# Patient Record
Sex: Female | Born: 1950
Health system: Southern US, Community
[De-identification: ages and names within clinical notes are randomized; demographics above are authoritative.]

---

## 1993-11-28 HISTORY — PX: BREAST EXCISIONAL BIOPSY: SUR124

## 1993-11-28 HISTORY — PX: BREAST BIOPSY: SHX20

## 1999-11-19 ENCOUNTER — Encounter: Admission: RE | Admit: 1999-11-19 | Discharge: 1999-11-19 | Payer: Self-pay | Admitting: *Deleted

## 1999-11-19 ENCOUNTER — Encounter: Payer: Self-pay | Admitting: *Deleted

## 2000-05-22 ENCOUNTER — Encounter: Payer: Self-pay | Admitting: *Deleted

## 2000-05-22 ENCOUNTER — Encounter: Admission: RE | Admit: 2000-05-22 | Discharge: 2000-05-22 | Payer: Self-pay | Admitting: *Deleted

## 2000-07-28 ENCOUNTER — Encounter: Admission: RE | Admit: 2000-07-28 | Discharge: 2000-07-28 | Payer: Self-pay | Admitting: General Surgery

## 2000-07-28 ENCOUNTER — Encounter: Payer: Self-pay | Admitting: General Surgery

## 2001-05-24 ENCOUNTER — Encounter: Payer: Self-pay | Admitting: Internal Medicine

## 2001-05-24 ENCOUNTER — Encounter: Admission: RE | Admit: 2001-05-24 | Discharge: 2001-05-24 | Payer: Self-pay | Admitting: Internal Medicine

## 2001-09-11 ENCOUNTER — Encounter: Admission: RE | Admit: 2001-09-11 | Discharge: 2001-09-11 | Payer: Self-pay | Admitting: Internal Medicine

## 2001-09-11 ENCOUNTER — Encounter: Payer: Self-pay | Admitting: Internal Medicine

## 2002-05-15 ENCOUNTER — Encounter: Admission: RE | Admit: 2002-05-15 | Discharge: 2002-05-15 | Payer: Self-pay | Admitting: Internal Medicine

## 2002-05-15 ENCOUNTER — Encounter: Payer: Self-pay | Admitting: Internal Medicine

## 2003-05-19 ENCOUNTER — Encounter: Admission: RE | Admit: 2003-05-19 | Discharge: 2003-05-19 | Payer: Self-pay | Admitting: Internal Medicine

## 2003-05-19 ENCOUNTER — Encounter: Payer: Self-pay | Admitting: Internal Medicine

## 2003-07-02 ENCOUNTER — Ambulatory Visit (HOSPITAL_COMMUNITY): Admission: RE | Admit: 2003-07-02 | Discharge: 2003-07-02 | Payer: Self-pay | Admitting: Gastroenterology

## 2004-05-19 ENCOUNTER — Encounter: Admission: RE | Admit: 2004-05-19 | Discharge: 2004-05-19 | Payer: Self-pay | Admitting: Internal Medicine

## 2004-05-20 ENCOUNTER — Encounter: Admission: RE | Admit: 2004-05-20 | Discharge: 2004-05-20 | Payer: Self-pay | Admitting: Internal Medicine

## 2005-09-26 ENCOUNTER — Other Ambulatory Visit: Admission: RE | Admit: 2005-09-26 | Discharge: 2005-09-26 | Payer: Self-pay | Admitting: Internal Medicine

## 2005-09-26 ENCOUNTER — Encounter: Admission: RE | Admit: 2005-09-26 | Discharge: 2005-09-26 | Payer: Self-pay | Admitting: Internal Medicine

## 2006-09-27 ENCOUNTER — Encounter: Admission: RE | Admit: 2006-09-27 | Discharge: 2006-09-27 | Payer: Self-pay | Admitting: Internal Medicine

## 2007-10-04 ENCOUNTER — Other Ambulatory Visit: Admission: RE | Admit: 2007-10-04 | Discharge: 2007-10-04 | Payer: Self-pay | Admitting: Internal Medicine

## 2007-10-04 ENCOUNTER — Encounter: Admission: RE | Admit: 2007-10-04 | Discharge: 2007-10-04 | Payer: Self-pay | Admitting: Internal Medicine

## 2007-10-12 ENCOUNTER — Encounter: Admission: RE | Admit: 2007-10-12 | Discharge: 2007-10-12 | Payer: Self-pay | Admitting: Internal Medicine

## 2008-03-28 ENCOUNTER — Encounter: Admission: RE | Admit: 2008-03-28 | Discharge: 2008-03-28 | Payer: Self-pay | Admitting: Internal Medicine

## 2008-12-15 ENCOUNTER — Encounter: Admission: RE | Admit: 2008-12-15 | Discharge: 2008-12-15 | Payer: Self-pay | Admitting: Internal Medicine

## 2009-06-11 ENCOUNTER — Encounter: Admission: RE | Admit: 2009-06-11 | Discharge: 2009-06-11 | Payer: Self-pay | Admitting: Internal Medicine

## 2009-12-24 ENCOUNTER — Encounter: Admission: RE | Admit: 2009-12-24 | Discharge: 2009-12-24 | Payer: Self-pay | Admitting: Internal Medicine

## 2009-12-24 ENCOUNTER — Other Ambulatory Visit: Admission: RE | Admit: 2009-12-24 | Discharge: 2009-12-24 | Payer: Self-pay | Admitting: Preventative Medicine

## 2010-12-18 ENCOUNTER — Other Ambulatory Visit: Payer: Self-pay | Admitting: Internal Medicine

## 2010-12-18 DIAGNOSIS — Z1239 Encounter for other screening for malignant neoplasm of breast: Secondary | ICD-10-CM

## 2011-01-17 ENCOUNTER — Ambulatory Visit
Admission: RE | Admit: 2011-01-17 | Discharge: 2011-01-17 | Disposition: A | Discharge status: Home / Self Care | Payer: BC Managed Care – PPO | Source: Ambulatory Visit | Attending: Internal Medicine | Admitting: Internal Medicine

## 2011-01-17 DIAGNOSIS — Z1239 Encounter for other screening for malignant neoplasm of breast: Secondary | ICD-10-CM

## 2011-01-24 ENCOUNTER — Other Ambulatory Visit: Payer: Self-pay | Admitting: Internal Medicine

## 2011-01-24 DIAGNOSIS — Z1231 Encounter for screening mammogram for malignant neoplasm of breast: Secondary | ICD-10-CM

## 2011-04-15 NOTE — Op Note (Signed)
   Heather White, Heather White                           ACCOUNT NO.:  [id number]   MEDICAL RECORD NO.:  [id number]                   PATIENT TYPE:  AMB   LOCATION:  ENDO                                 FACILITY:  [facility]   PHYSICIAN:  Danise Edge, M.D.                DATE OF BIRTH:  1951/05/28   DATE OF PROCEDURE:  07/02/2003  DATE OF DISCHARGE:                                 OPERATIVE REPORT   PROCEDURE:  Screening colonoscopy.   INDICATIONS:  The patient is a 60 year old female born [birth year].  The patient is referred to undergo her first screening colonoscopy with  polypectomy to prevent colon cancer.   ENDOSCOPIST:  Charolett Bumpers, M.D.   PREMEDICATION:  1. Demerol 50 mg.  2. Versed 5 mg.   DESCRIPTION OF PROCEDURE:  After obtaining informed consent, the patient was  placed in the left lateral decubitus position.  I administered intravenous  Demerol and intravenous Versed to achieve conscious sedation for the  procedure.  The patient's blood pressure, oxygen saturation and cardiac  rhythm were monitored throughout the procedure and documented in the medical  record.   Anal inspection was normal.  Digital rectal examination was normal.  The  Olympus pediatric colonoscope was introduced into the rectum and advanced to  the cecum.  The patient does have a tortuous left colon, with tentatively  deformed loop formation.  Colonic preparation for the examination today was  excellent.   FINDINGS:  RECTUM:  Normal.  SIGMOID COLON:  Normal.  DESCENDING COLON:  Normal.  SPLENIC FLEXURE:  Normal.  TRANSVERSE COLON:  Normal.  HEPATIC FLEXURE:  Normal.  ASCENDING COLON:  Normal.  CECUM AND ILEOCECAL VALVE:  Normal.   ASSESSMENT:  Normal screening proctocolonoscopy to the cecum.                                                Danise Edge, M.D.    MJ/MEDQ  D:  07/02/2003  T:  07/02/2003  Job:  [id number]   cc:   Georgann Housekeeper, M.D.  [address]  [place]  [place] [address]  Fax: [phone]

## 2012-01-23 ENCOUNTER — Ambulatory Visit
Admission: RE | Admit: 2012-01-23 | Discharge: 2012-01-23 | Disposition: A | Discharge status: Home / Self Care | Payer: BC Managed Care – PPO | Source: Ambulatory Visit | Attending: Internal Medicine | Admitting: Internal Medicine

## 2012-01-23 ENCOUNTER — Ambulatory Visit: Payer: BC Managed Care – PPO

## 2012-01-23 DIAGNOSIS — Z1231 Encounter for screening mammogram for malignant neoplasm of breast: Secondary | ICD-10-CM

## 2012-11-29 ENCOUNTER — Other Ambulatory Visit: Payer: Self-pay | Admitting: Internal Medicine

## 2012-11-29 DIAGNOSIS — Z1231 Encounter for screening mammogram for malignant neoplasm of breast: Secondary | ICD-10-CM

## 2013-01-28 ENCOUNTER — Other Ambulatory Visit (HOSPITAL_COMMUNITY)
Admission: RE | Admit: 2013-01-28 | Discharge: 2013-01-28 | Disposition: A | Discharge status: Home / Self Care | Payer: BC Managed Care – PPO | Source: Ambulatory Visit | Attending: Internal Medicine | Admitting: Internal Medicine

## 2013-01-28 ENCOUNTER — Ambulatory Visit
Admission: RE | Admit: 2013-01-28 | Discharge: 2013-01-28 | Disposition: A | Discharge status: Home / Self Care | Payer: BC Managed Care – PPO | Source: Ambulatory Visit | Attending: Internal Medicine | Admitting: Internal Medicine

## 2013-01-28 ENCOUNTER — Other Ambulatory Visit: Payer: Self-pay | Admitting: Internal Medicine

## 2013-01-28 DIAGNOSIS — Z1231 Encounter for screening mammogram for malignant neoplasm of breast: Secondary | ICD-10-CM

## 2013-01-28 DIAGNOSIS — Z01419 Encounter for gynecological examination (general) (routine) without abnormal findings: Secondary | ICD-10-CM | POA: Insufficient documentation

## 2013-01-28 DIAGNOSIS — Z1151 Encounter for screening for human papillomavirus (HPV): Secondary | ICD-10-CM | POA: Insufficient documentation

## 2013-05-23 ENCOUNTER — Other Ambulatory Visit: Payer: Self-pay | Admitting: Gastroenterology

## 2013-12-09 ENCOUNTER — Other Ambulatory Visit: Payer: Self-pay

## 2013-12-09 DIAGNOSIS — Z1231 Encounter for screening mammogram for malignant neoplasm of breast: Secondary | ICD-10-CM

## 2014-01-30 ENCOUNTER — Ambulatory Visit
Admission: RE | Admit: 2014-01-30 | Discharge: 2014-01-30 | Disposition: A | Discharge status: Home / Self Care | Payer: BC Managed Care – PPO | Source: Ambulatory Visit

## 2014-01-30 DIAGNOSIS — Z1231 Encounter for screening mammogram for malignant neoplasm of breast: Secondary | ICD-10-CM

## 2014-12-05 ENCOUNTER — Other Ambulatory Visit: Payer: Self-pay

## 2014-12-05 DIAGNOSIS — Z1231 Encounter for screening mammogram for malignant neoplasm of breast: Secondary | ICD-10-CM

## 2015-02-20 ENCOUNTER — Ambulatory Visit
Admission: RE | Admit: 2015-02-20 | Discharge: 2015-02-20 | Disposition: A | Discharge status: Home / Self Care | Payer: BC Managed Care – PPO | Source: Ambulatory Visit

## 2015-02-20 DIAGNOSIS — Z1231 Encounter for screening mammogram for malignant neoplasm of breast: Secondary | ICD-10-CM

## 2015-09-23 ENCOUNTER — Other Ambulatory Visit: Payer: Self-pay

## 2015-09-23 DIAGNOSIS — Z1231 Encounter for screening mammogram for malignant neoplasm of breast: Secondary | ICD-10-CM

## 2016-02-29 ENCOUNTER — Ambulatory Visit
Admission: RE | Admit: 2016-02-29 | Discharge: 2016-02-29 | Disposition: A | Discharge status: Home / Self Care | Payer: BC Managed Care – PPO | Source: Ambulatory Visit

## 2016-02-29 DIAGNOSIS — Z1231 Encounter for screening mammogram for malignant neoplasm of breast: Secondary | ICD-10-CM

## 2016-03-14 ENCOUNTER — Other Ambulatory Visit: Payer: Self-pay | Admitting: Internal Medicine

## 2016-03-14 DIAGNOSIS — R110 Nausea: Secondary | ICD-10-CM

## 2016-03-14 DIAGNOSIS — R1010 Upper abdominal pain, unspecified: Secondary | ICD-10-CM

## 2016-03-14 DIAGNOSIS — R1013 Epigastric pain: Secondary | ICD-10-CM

## 2016-03-17 ENCOUNTER — Ambulatory Visit
Admission: RE | Admit: 2016-03-17 | Discharge: 2016-03-17 | Disposition: A | Discharge status: Home / Self Care | Payer: BC Managed Care – PPO | Source: Ambulatory Visit | Attending: Internal Medicine | Admitting: Internal Medicine

## 2016-03-17 ENCOUNTER — Other Ambulatory Visit: Payer: Self-pay | Admitting: Internal Medicine

## 2016-03-17 DIAGNOSIS — R1010 Upper abdominal pain, unspecified: Secondary | ICD-10-CM

## 2016-03-17 DIAGNOSIS — R110 Nausea: Secondary | ICD-10-CM

## 2016-03-17 DIAGNOSIS — R1013 Epigastric pain: Secondary | ICD-10-CM

## 2016-03-21 ENCOUNTER — Other Ambulatory Visit: Payer: Self-pay | Admitting: Gastroenterology

## 2016-03-28 ENCOUNTER — Other Ambulatory Visit (HOSPITAL_COMMUNITY): Payer: Self-pay | Admitting: Internal Medicine

## 2016-03-28 DIAGNOSIS — R110 Nausea: Secondary | ICD-10-CM

## 2016-03-28 DIAGNOSIS — R109 Unspecified abdominal pain: Secondary | ICD-10-CM

## 2016-03-31 ENCOUNTER — Ambulatory Visit (HOSPITAL_COMMUNITY)
Admission: RE | Admit: 2016-03-31 | Discharge: 2016-03-31 | Disposition: A | Discharge status: Home / Self Care | Payer: BC Managed Care – PPO | Source: Ambulatory Visit | Attending: Internal Medicine | Admitting: Internal Medicine

## 2016-03-31 DIAGNOSIS — R109 Unspecified abdominal pain: Secondary | ICD-10-CM | POA: Diagnosis not present

## 2016-03-31 DIAGNOSIS — R110 Nausea: Secondary | ICD-10-CM | POA: Diagnosis not present

## 2016-03-31 MED ORDER — TECHNETIUM TC 99M MEBROFENIN IV KIT
5.2000 | PACK | Freq: Once | INTRAVENOUS | Status: AC | PRN
  Administered 2016-03-31: 5 via INTRAVENOUS

## 2016-04-22 ENCOUNTER — Other Ambulatory Visit: Payer: Self-pay | Admitting: Internal Medicine

## 2016-04-22 ENCOUNTER — Ambulatory Visit
Admission: RE | Admit: 2016-04-22 | Discharge: 2016-04-22 | Disposition: A | Discharge status: Home / Self Care | Payer: BC Managed Care – PPO | Source: Ambulatory Visit | Attending: Internal Medicine | Admitting: Internal Medicine

## 2016-04-22 DIAGNOSIS — R1010 Upper abdominal pain, unspecified: Secondary | ICD-10-CM

## 2016-04-22 DIAGNOSIS — R112 Nausea with vomiting, unspecified: Secondary | ICD-10-CM

## 2016-04-22 DIAGNOSIS — R1032 Left lower quadrant pain: Secondary | ICD-10-CM

## 2016-04-22 MED ORDER — IOPAMIDOL (ISOVUE-300) INJECTION 61%
100.0000 mL | Freq: Once | INTRAVENOUS | Status: AC | PRN
  Administered 2016-04-22: 100 mL via INTRAVENOUS

## 2016-04-27 ENCOUNTER — Ambulatory Visit
Admission: RE | Admit: 2016-04-27 | Discharge: 2016-04-27 | Disposition: A | Discharge status: Home / Self Care | Payer: BC Managed Care – PPO | Source: Ambulatory Visit | Attending: Internal Medicine | Admitting: Internal Medicine

## 2016-04-27 ENCOUNTER — Other Ambulatory Visit: Payer: Self-pay | Admitting: Internal Medicine

## 2016-04-27 DIAGNOSIS — R531 Weakness: Secondary | ICD-10-CM

## 2016-04-29 ENCOUNTER — Other Ambulatory Visit: Payer: Self-pay | Admitting: Internal Medicine

## 2016-04-29 DIAGNOSIS — R531 Weakness: Secondary | ICD-10-CM

## 2016-05-05 ENCOUNTER — Ambulatory Visit
Admission: RE | Admit: 2016-05-05 | Discharge: 2016-05-05 | Disposition: A | Discharge status: Home / Self Care | Payer: BC Managed Care – PPO | Source: Ambulatory Visit | Attending: Internal Medicine | Admitting: Internal Medicine

## 2016-05-05 DIAGNOSIS — R531 Weakness: Secondary | ICD-10-CM

## 2016-05-05 MED ORDER — IOPAMIDOL (ISOVUE-300) INJECTION 61%
75.0000 mL | Freq: Once | INTRAVENOUS | Status: AC | PRN
  Administered 2016-05-05: 75 mL via INTRAVENOUS

## 2016-05-13 DIAGNOSIS — I951 Orthostatic hypotension: Secondary | ICD-10-CM | POA: Diagnosis not present

## 2016-05-13 DIAGNOSIS — R55 Syncope and collapse: Secondary | ICD-10-CM | POA: Diagnosis not present

## 2016-05-13 DIAGNOSIS — R112 Nausea with vomiting, unspecified: Secondary | ICD-10-CM | POA: Diagnosis not present

## 2016-05-14 DIAGNOSIS — I951 Orthostatic hypotension: Secondary | ICD-10-CM | POA: Diagnosis not present

## 2016-05-14 DIAGNOSIS — R112 Nausea with vomiting, unspecified: Secondary | ICD-10-CM | POA: Diagnosis not present

## 2016-05-14 DIAGNOSIS — R55 Syncope and collapse: Secondary | ICD-10-CM | POA: Diagnosis not present

## 2016-05-25 ENCOUNTER — Other Ambulatory Visit (HOSPITAL_COMMUNITY): Payer: BC Managed Care – PPO

## 2016-06-13 ENCOUNTER — Institutional Professional Consult (permissible substitution): Payer: BC Managed Care – PPO | Admitting: Pulmonary Disease

## 2016-11-28 HISTORY — PX: BREAST BIOPSY: SHX20

## 2016-12-27 ENCOUNTER — Other Ambulatory Visit: Payer: Self-pay | Admitting: Internal Medicine

## 2016-12-27 DIAGNOSIS — Z1231 Encounter for screening mammogram for malignant neoplasm of breast: Secondary | ICD-10-CM

## 2017-03-29 ENCOUNTER — Ambulatory Visit
Admission: RE | Admit: 2017-03-29 | Discharge: 2017-03-29 | Disposition: A | Discharge status: Home / Self Care | Payer: BC Managed Care – PPO | Source: Ambulatory Visit | Attending: Internal Medicine | Admitting: Internal Medicine

## 2017-03-29 DIAGNOSIS — Z1231 Encounter for screening mammogram for malignant neoplasm of breast: Secondary | ICD-10-CM

## 2017-03-30 ENCOUNTER — Other Ambulatory Visit: Payer: Self-pay | Admitting: Internal Medicine

## 2017-03-30 DIAGNOSIS — R928 Other abnormal and inconclusive findings on diagnostic imaging of breast: Secondary | ICD-10-CM

## 2017-04-03 ENCOUNTER — Ambulatory Visit
Admission: RE | Admit: 2017-04-03 | Discharge: 2017-04-03 | Disposition: A | Discharge status: Home / Self Care | Payer: BC Managed Care – PPO | Source: Ambulatory Visit | Attending: Internal Medicine | Admitting: Internal Medicine

## 2017-04-03 ENCOUNTER — Other Ambulatory Visit: Payer: Self-pay | Admitting: Internal Medicine

## 2017-04-03 DIAGNOSIS — R921 Mammographic calcification found on diagnostic imaging of breast: Secondary | ICD-10-CM

## 2017-04-03 DIAGNOSIS — R928 Other abnormal and inconclusive findings on diagnostic imaging of breast: Secondary | ICD-10-CM

## 2017-04-05 ENCOUNTER — Ambulatory Visit
Admission: RE | Admit: 2017-04-05 | Discharge: 2017-04-05 | Disposition: A | Discharge status: Home / Self Care | Payer: BC Managed Care – PPO | Source: Ambulatory Visit | Attending: Internal Medicine | Admitting: Internal Medicine

## 2017-04-05 DIAGNOSIS — R921 Mammographic calcification found on diagnostic imaging of breast: Secondary | ICD-10-CM

## 2017-06-06 IMAGING — NM NM HEPATO W/GB/PHARM/[PERSON_NAME]
2 series · 12 of 12 positions shown · non-contrast
Comparison: Ultrasound March 17, 2016.

CLINICAL DATA: Epigastric abdominal pain, vomiting, weight loss.

EXAM:
NUCLEAR MEDICINE HEPATOBILIARY IMAGING WITH GALLBLADDER EF
TECHNIQUE: Sequential images of the abdomen were obtained [DATE] minutes
following intravenous administration of radiopharmaceutical. After
oral ingestion of Ensure, gallbladder ejection fraction was
determined. At 60 min, normal ejection fraction is greater than 33%.
RADIOPHARMACEUTICALS:  5.2 mCi 8c-LLm  Choletec IV

[Series 1: biliary · 4.14mm/px · 6 of 52 frames shown]
[frame 5/52]
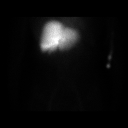
[frame 13/52]
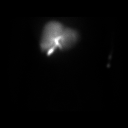
[frame 22/52]
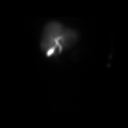
[frame 31/52]
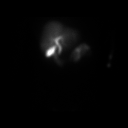
[frame 39/52]
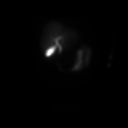
[frame 48/52]
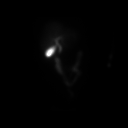

[Series 3: gbef · 4.14mm/px · 6 of 60 frames shown]
[frame 6/60]
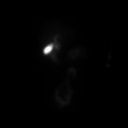
[frame 16/60]
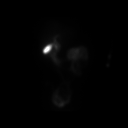
[frame 26/60]
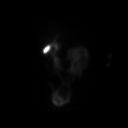
[frame 36/60]
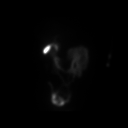
[frame 46/60]
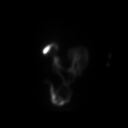
[frame 56/60]
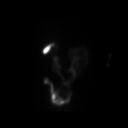

[12 of 12 positions shown; findings below may reference images not displayed]

FINDINGS: Prompt uptake and biliary excretion of activity by the liver is
seen. Gallbladder activity is visualized, consistent with patency of
cystic duct. Biliary activity passes into small bowel, consistent
with patent common bile duct.

Calculated gallbladder ejection fraction is 52%. (Normal gallbladder
ejection fraction with Ensure is greater than 33%.) patient reported
no discomfort her pain after ingesting Ensure.
IMPRESSION: Normal gallbladder ejection fraction is noted after Ensure
administration.

## 2018-01-18 ENCOUNTER — Other Ambulatory Visit: Payer: Self-pay | Admitting: Internal Medicine

## 2018-01-18 DIAGNOSIS — Z1231 Encounter for screening mammogram for malignant neoplasm of breast: Secondary | ICD-10-CM

## 2018-03-30 ENCOUNTER — Ambulatory Visit
Admission: RE | Admit: 2018-03-30 | Discharge: 2018-03-30 | Disposition: A | Discharge status: Home / Self Care | Payer: BC Managed Care – PPO | Source: Ambulatory Visit | Attending: Internal Medicine | Admitting: Internal Medicine

## 2018-03-30 DIAGNOSIS — Z1231 Encounter for screening mammogram for malignant neoplasm of breast: Secondary | ICD-10-CM

## 2018-04-02 ENCOUNTER — Other Ambulatory Visit: Payer: Self-pay | Admitting: Internal Medicine

## 2018-04-02 DIAGNOSIS — R921 Mammographic calcification found on diagnostic imaging of breast: Secondary | ICD-10-CM

## 2018-04-04 ENCOUNTER — Ambulatory Visit
Admission: RE | Admit: 2018-04-04 | Discharge: 2018-04-04 | Disposition: A | Discharge status: Home / Self Care | Payer: BC Managed Care – PPO | Source: Ambulatory Visit | Attending: Internal Medicine | Admitting: Internal Medicine

## 2018-04-04 ENCOUNTER — Other Ambulatory Visit: Payer: Self-pay | Admitting: Internal Medicine

## 2018-04-04 DIAGNOSIS — R921 Mammographic calcification found on diagnostic imaging of breast: Secondary | ICD-10-CM

## 2018-06-18 ENCOUNTER — Ambulatory Visit: Payer: Self-pay | Admitting: Podiatry

## 2018-06-21 ENCOUNTER — Other Ambulatory Visit: Payer: Self-pay | Admitting: Sports Medicine

## 2018-06-21 ENCOUNTER — Ambulatory Visit: Payer: BC Managed Care – PPO | Admitting: Sports Medicine

## 2018-06-21 ENCOUNTER — Ambulatory Visit (INDEPENDENT_AMBULATORY_CARE_PROVIDER_SITE_OTHER): Payer: BC Managed Care – PPO

## 2018-06-21 ENCOUNTER — Encounter: Payer: Self-pay | Admitting: Sports Medicine

## 2018-06-21 VITALS — BP 102/59 | HR 67 | Temp 98.9°F | Resp 16 | Ht 63.0 in | Wt 127.0 lb

## 2018-06-21 DIAGNOSIS — M779 Enthesopathy, unspecified: Secondary | ICD-10-CM

## 2018-06-21 DIAGNOSIS — M21619 Bunion of unspecified foot: Secondary | ICD-10-CM

## 2018-06-21 DIAGNOSIS — M7742 Metatarsalgia, left foot: Secondary | ICD-10-CM | POA: Diagnosis not present

## 2018-06-21 DIAGNOSIS — M79672 Pain in left foot: Secondary | ICD-10-CM

## 2018-06-21 DIAGNOSIS — M2040 Other hammer toe(s) (acquired), unspecified foot: Secondary | ICD-10-CM

## 2018-06-21 MED ORDER — TRIAMCINOLONE ACETONIDE 10 MG/ML IJ SUSP: 10.0000 mg | Freq: Once | INTRAMUSCULAR | Status: AC

## 2018-06-21 NOTE — Progress Notes (Signed)
   Subjective:    Patient ID: Heather White, female    DOB: Jun 06, 1951, 67 y.o.   MRN: [id number]  HPI    Review of Systems  Musculoskeletal: Positive for arthralgias and myalgias.  All other systems reviewed and are negative.      Objective:   Physical Exam        Assessment & Plan:

## 2018-06-21 NOTE — Progress Notes (Signed)
Subjective: Heather White is a 67 y.o. female patient who presents to office for evaluation of Left foot pain. Patient complains of progressive pain especially over the last year in the left foot at the ball underneath the second and third toes that is sharp in nature on average 5 out of 10 states that when she is walking extensively or standing pain worsens however she has found certain shoes that seem to help states that she has tried massage and Epson salt with no complete relief in symptoms and also has tried Aleve.  Patient states that the areas used to swell but no longer is there is swelling.  Patient denies any known injury however does admit to an incident of having her toes flexed back after she tripped over a rug in her home that happened roughly about a year ago.  Patient does admits to a history of Addison's disease and is on long standing steroids by mouth and also has a history of hypothyroidism.Patient denies any other pedal complaints.   Review of Systems  Musculoskeletal: Positive for joint pain and myalgias.  All other systems reviewed and are negative.   There are no active problems to display for this patient.   Current Outpatient Medications on File Prior to Visit  Medication Sig Dispense Refill  . Ascorbic Acid (VITAMIN C) 100 MG tablet Take 100 mg by mouth daily.    Marland Kitchen atorvastatin (LIPITOR) 10 MG tablet Take 10 mg by mouth daily.    . clonazePAM (KLONOPIN) 0.5 MG tablet Take 0.5 mg by mouth 2 (two) times daily as needed for anxiety.    . Coenzyme Q10 (COQ10) 200 MG CAPS Take by mouth.    . escitalopram (LEXAPRO) 5 MG tablet Take 5 mg by mouth daily.    . hydrocortisone (CORTEF) 10 MG tablet Take 10 mg by mouth daily.    Marland Kitchen levothyroxine (SYNTHROID, LEVOTHROID) 25 MCG tablet Take 25 mcg by mouth daily before breakfast.    . vitamin E 400 UNIT capsule Take 400 Units by mouth daily.     No current facility-administered medications on file prior to visit.     Allergies   Allergen Reactions  . Sulfa Antibiotics Rash    Objective:  General: Alert and oriented x3 in no acute distress  Dermatology: No open lesions bilateral lower extremities, no webspace macerations, no ecchymosis bilateral, all nails x 10 are well manicured.  Vascular: Dorsalis Pedis and Posterior Tibial pedal pulses palpable 1 out of 4, Capillary Fill Time 3 seconds,(+) pedal hair growth bilateral, no edema bilateral lower extremities, Temperature gradient within normal limits.  Neurology: Johney Maine sensation intact via light touch bilateral, (- )Tinels sign bilateral.   Musculoskeletal: Mild tenderness with palpation at left second metatarsophalangeal joint greater than the left third metatarsophalangeal joint,No pain with calf compression bilateral.  Patient has significant structural bunion and mild varus rotated fourth and fifth hammertoes left greater than right. Strength within normal limits in all groups bilateral.   Gait: Antalgic gait  Xrays  Left foot   Impression: Normal osseous mineralization, mild joint space narrowing and mild erosions at the first metatarsophalangeal joint at the area of bunion with large medial eminence suggestive of arthritis there is mild metatarsal space narrowing at the second and third metatarsals, there is mild varus rotated fourth and fifth hammertoe deformity, all soft tissues are within normal limits, no other acute findings.  Assessment and Plan: Problem List Items Addressed This Visit    None    Visit  Diagnoses    Capsulitis    -  Primary   Relevant Medications   triamcinolone acetonide (KENALOG) 10 MG/ML injection 10 mg   Left foot pain       Metatarsalgia of left foot       Bunion       Hammer toe, unspecified laterality         -Complete examination performed -Xrays reviewed -Discussed treatement options for likely metatarsalgia and capsulitis secondary to structural deformity versus sprain injury versus partial tear at plantar  plate -After oral consent and aseptic prep, injected a mixture containing 1 ml of 2%  plain lidocaine, 1 ml 0.5% plain marcaine, 0.5 ml of kenalog 10 and 0.5 ml of dexamethasone phosphate into left foot second and third intermetatarsal space and lesser metatarsophalangeal joints without complication. Post-injection care discussed with patient.  -Applied removable strapping with metatarsal padding to left forefoot advised patient if this works well would benefit from custom orthotics -Patient to return to office in 3 to 4 weeks for follow-up evaluation or sooner if condition worsens.  Landis Martins, DPM

## 2018-07-12 ENCOUNTER — Encounter: Payer: Self-pay | Admitting: Sports Medicine

## 2018-07-12 ENCOUNTER — Ambulatory Visit: Payer: BC Managed Care – PPO | Admitting: Sports Medicine

## 2018-07-12 DIAGNOSIS — M779 Enthesopathy, unspecified: Secondary | ICD-10-CM

## 2018-07-12 DIAGNOSIS — M7742 Metatarsalgia, left foot: Secondary | ICD-10-CM | POA: Diagnosis not present

## 2018-07-12 DIAGNOSIS — M79672 Pain in left foot: Secondary | ICD-10-CM | POA: Diagnosis not present

## 2018-07-12 NOTE — Progress Notes (Signed)
Subjective: Heather White is a 67 y.o. female patient who returns to office for follow up evaluation of Left foot pain. Patient now ranks pain 2/10 and states that she is feeling better. Patient has been using met pad with relief in symptoms. Denies bruising, swelling, warmth, redness, or any other pedal symptoms. Patient denies any other pedal complaints.   There are no active problems to display for this patient.  Current Outpatient Medications on File Prior to Visit  Medication Sig Dispense Refill  . Ascorbic Acid (VITAMIN C) 100 MG tablet Take 100 mg by mouth daily.    Marland Kitchen atorvastatin (LIPITOR) 10 MG tablet Take 10 mg by mouth daily.    . clonazePAM (KLONOPIN) 0.5 MG tablet Take 0.5 mg by mouth 2 (two) times daily as needed for anxiety.    . Coenzyme Q10 (COQ10) 200 MG CAPS Take by mouth.    . escitalopram (LEXAPRO) 5 MG tablet Take 5 mg by mouth daily.    . hydrocortisone (CORTEF) 10 MG tablet Take 10 mg by mouth daily.    Marland Kitchen levothyroxine (SYNTHROID, LEVOTHROID) 25 MCG tablet Take 25 mcg by mouth daily before breakfast.    . vitamin E 400 UNIT capsule Take 400 Units by mouth daily.     Current Facility-Administered Medications on File Prior to Visit  Medication Dose Route Frequency Provider Last Rate Last Dose  . triamcinolone acetonide (KENALOG) 10 MG/ML injection 10 mg  10 mg Other Once Landis Martins, DPM       Allergies  Allergen Reactions  . Sulfa Antibiotics Rash    Objective:  General: Alert and oriented x3 in no acute distress  Dermatology: No open lesions bilateral lower extremities, no webspace macerations, no ecchymosis bilateral, all nails x 10 are well manicured.  Neurovascular: Intact. No lower extremity edema bilateral.   Musculoskeletal:Decreased tenderness with palpation at Left plantar forefoot especially sub met 2-3, No pain with calf compression bilateral. Range of motion in all pedal joints are within normal limits without pain or crepitation. + bunion and  hammertoe deformity, Strength within normal limits in all groups bilateral.   Assessment and Plan: Problem List Items Addressed This Visit    None    Visit Diagnoses    Metatarsalgia of left foot    -  Primary   Capsulitis       Left foot pain           -Complete examination performed -Discussed continued care for resolving capsulitis -Recommend good supportive shoes -Topical OTC pain creams PRN -Recommend custom insoles if patient desires to get them to schedule patient to return to office for casting with Betha.   Landis Martins, DPM

## 2018-08-02 ENCOUNTER — Ambulatory Visit: Payer: BC Managed Care – PPO | Admitting: *Deleted

## 2018-08-02 DIAGNOSIS — M779 Enthesopathy, unspecified: Secondary | ICD-10-CM

## 2018-08-02 DIAGNOSIS — M7742 Metatarsalgia, left foot: Secondary | ICD-10-CM

## 2018-09-12 ENCOUNTER — Ambulatory Visit (INDEPENDENT_AMBULATORY_CARE_PROVIDER_SITE_OTHER): Payer: BC Managed Care – PPO

## 2018-09-12 ENCOUNTER — Ambulatory Visit: Payer: BC Managed Care – PPO | Admitting: Sports Medicine

## 2018-09-12 ENCOUNTER — Encounter: Payer: Self-pay | Admitting: Sports Medicine

## 2018-09-12 DIAGNOSIS — M79672 Pain in left foot: Secondary | ICD-10-CM | POA: Diagnosis not present

## 2018-09-12 DIAGNOSIS — M779 Enthesopathy, unspecified: Secondary | ICD-10-CM | POA: Diagnosis not present

## 2018-09-12 DIAGNOSIS — M84378A Stress fracture, left toe(s), initial encounter for fracture: Secondary | ICD-10-CM

## 2018-09-12 MED ORDER — TRIAMCINOLONE ACETONIDE 10 MG/ML IJ SUSP: 10.0000 mg | Freq: Once | INTRAMUSCULAR | Status: AC

## 2018-09-12 NOTE — Patient Instructions (Signed)

## 2018-09-12 NOTE — Progress Notes (Signed)
Subjective: Heather White is a 67 y.o. female patient who presents to office for evaluation of left foot pain from the base of the fourth toe onto the top of the foot for the last month states that the pain is worse with pressure and walking states that pain is 7 out of 10 denies any injury has been using Advil icing does note swelling by the end of the day.  Patient is also here for pickup of orthotics. Patient denies any other pedal complaints.  There are no active problems to display for this patient.   Current Outpatient Medications on File Prior to Visit  Medication Sig Dispense Refill  . Ascorbic Acid (VITAMIN C) 100 MG tablet Take 100 mg by mouth daily.    Marland Kitchen atorvastatin (LIPITOR) 10 MG tablet Take 10 mg by mouth daily.    . clonazePAM (KLONOPIN) 0.5 MG tablet Take 0.5 mg by mouth 2 (two) times daily as needed for anxiety.    . Coenzyme Q10 (COQ10) 200 MG CAPS Take by mouth.    . escitalopram (LEXAPRO) 5 MG tablet Take 5 mg by mouth daily.    . hydrocortisone (CORTEF) 10 MG tablet Take 10 mg by mouth daily.    Marland Kitchen levothyroxine (SYNTHROID, LEVOTHROID) 25 MCG tablet Take 25 mcg by mouth daily before breakfast.    . SUMAtriptan (IMITREX) 50 MG tablet TAKE 1 TABLET BY MOUTH AT ONSET OF MIGRAINE. MAY REPEAT ONCE AFTER 2 HOURS IF NEEDED - max 2 in 24 hours  6  . vitamin E 400 UNIT capsule Take 400 Units by mouth daily.     Current Facility-Administered Medications on File Prior to Visit  Medication Dose Route Frequency Provider Last Rate Last Dose  . triamcinolone acetonide (KENALOG) 10 MG/ML injection 10 mg  10 mg Other Once Landis Martins, DPM        Allergies  Allergen Reactions  . Sulfa Antibiotics Rash    Objective:  General: Alert and oriented x3 in no acute distress  Dermatology: No open lesions bilateral lower extremities, no webspace macerations, no ecchymosis bilateral, all nails x 10 are well manicured.  Vascular: Dorsalis Pedis and Posterior Tibial pedal pulses palpable,  Capillary Fill Time 3 seconds,(+) pedal hair growth bilateral, no edema bilateral lower extremities, Temperature gradient within normal limits.  Neurology: Johney Maine sensation intact via light touch bilateral.  Musculoskeletal: Mild tenderness with palpation at fourth metatarsal shaft and along the extensor tendon.  Bunion and hammertoe deformity no pain with palpation of the left plantar forefoot  Gait: Antalgic gait  Xrays  Left foot   Impression: Normal osseous mineralization there is no acute findings especially along the fourth metatarsal concerning of any type of acute stress fracture which is the reason why we x-ray to rule this out likely patient's symptoms could be from tendinitis.  Assessment and Plan: Problem List Items Addressed This Visit    None    Visit Diagnoses    Left foot pain    -  Primary   Relevant Medications   triamcinolone acetonide (KENALOG) 10 MG/ML injection 10 mg (Start on 09/12/2018 10:45 AM)   Other Relevant Orders   DG Foot Complete Left   Capsulitis       Relevant Medications   triamcinolone acetonide (KENALOG) 10 MG/ML injection 10 mg (Start on 09/12/2018 10:45 AM)   Other Relevant Orders   DG Foot Complete Left   Stress fracture of phalanx of toe of left foot, initial encounter  Relevant Orders   DG Foot Complete Left   Tendonitis       Relevant Medications   triamcinolone acetonide (KENALOG) 10 MG/ML injection 10 mg (Start on 09/12/2018 10:45 AM)       -Complete examination performed -Xrays reviewed -Discussed treatement options for likely tendinitis -After oral consent and aseptic prep, injected a mixture containing 1 ml of 2%  plain lidocaine, 1 ml 0.5% plain marcaine, 0.5 ml of kenalog 10 and 0.5 ml of dexamethasone phosphate into extensor tendon to the left fourth toe without complication. Post-injection care discussed with patient.  -Recommend protection rest ice elevation and to refrain from wearing her custom insoles for this week  may use try them on next week slowly with break in.  Explained as they were dispensed on today's visit advised patient if pain worsens to return to office  Landis Martins, DPM

## 2018-09-18 ENCOUNTER — Other Ambulatory Visit: Payer: Self-pay | Admitting: Sports Medicine

## 2018-09-18 DIAGNOSIS — M84378A Stress fracture, left toe(s), initial encounter for fracture: Secondary | ICD-10-CM

## 2018-09-18 DIAGNOSIS — M79672 Pain in left foot: Secondary | ICD-10-CM

## 2018-09-18 DIAGNOSIS — M779 Enthesopathy, unspecified: Secondary | ICD-10-CM

## 2018-09-28 ENCOUNTER — Ambulatory Visit (INDEPENDENT_AMBULATORY_CARE_PROVIDER_SITE_OTHER): Payer: Medicare Other | Admitting: Sports Medicine

## 2018-09-28 ENCOUNTER — Encounter: Payer: Self-pay | Admitting: Sports Medicine

## 2018-09-28 DIAGNOSIS — T148XXA Other injury of unspecified body region, initial encounter: Secondary | ICD-10-CM

## 2018-09-28 DIAGNOSIS — M779 Enthesopathy, unspecified: Secondary | ICD-10-CM

## 2018-09-28 DIAGNOSIS — M7752 Other enthesopathy of left foot: Secondary | ICD-10-CM

## 2018-09-28 DIAGNOSIS — M79672 Pain in left foot: Secondary | ICD-10-CM

## 2018-09-28 NOTE — Progress Notes (Signed)
Subjective: Heather White is a 67 y.o. female patient who presents to office for evaluation of left foot pain from the base of the fourth toe onto the top of the foot reports that when she first wear her orthotics she wore them all day by mistake and developed a blister on the side of the big toe joint but did feel like they are helping however still has pretty significant pain at the top of her left foot that has not gotten any better after I gave her a shot of medicine states that the pain is about 5 out of 10 states that she is been using Aleve and icing states when she rest her foot it feels better and she just retired from her job on yesterday.  Patient denies any new trauma or injury to her left foot but does state that prior to having pain and possibly several months before she did remember stubbing her foot and having some bruising and is unsure if this is related to the current pain that she is having at the top of her left foot.  No other acute symptoms noted.  Denies nausea vomiting fever chills does admit to a little bruising otherwise denies any other pedal complaints at this time.  There are no active problems to display for this patient.   Current Outpatient Medications on File Prior to Visit  Medication Sig Dispense Refill  . Ascorbic Acid (VITAMIN C) 100 MG tablet Take 100 mg by mouth daily.    Marland Kitchen atorvastatin (LIPITOR) 10 MG tablet Take 10 mg by mouth daily.    . clonazePAM (KLONOPIN) 0.5 MG tablet Take 0.5 mg by mouth 2 (two) times daily as needed for anxiety.    . Coenzyme Q10 (COQ10) 200 MG CAPS Take by mouth.    . escitalopram (LEXAPRO) 5 MG tablet Take 5 mg by mouth daily.    . hydrocortisone (CORTEF) 10 MG tablet Take 10 mg by mouth daily.    Marland Kitchen levothyroxine (SYNTHROID, LEVOTHROID) 25 MCG tablet Take 25 mcg by mouth daily before breakfast.    . SUMAtriptan (IMITREX) 50 MG tablet TAKE 1 TABLET BY MOUTH AT ONSET OF MIGRAINE. MAY REPEAT ONCE AFTER 2 HOURS IF NEEDED - max 2 in 24  hours  6  . vitamin E 400 UNIT capsule Take 400 Units by mouth daily.     Current Facility-Administered Medications on File Prior to Visit  Medication Dose Route Frequency Provider Last Rate Last Dose  . triamcinolone acetonide (KENALOG) 10 MG/ML injection 10 mg  10 mg Other Once Landis Martins, DPM      . triamcinolone acetonide (KENALOG) 10 MG/ML injection 10 mg  10 mg Other Once Landis Martins, DPM        Allergies  Allergen Reactions  . Sulfa Antibiotics Rash    Objective:  General: Alert and oriented x3 in no acute distress  Dermatology: No open lesions bilateral lower extremities, no webspace macerations, minimal faint ecchymosis dorsal lateral midfoot on the left which could likely be related to injection last visit, all nails x 10 are well manicured.  Vascular: Dorsalis Pedis and Posterior Tibial pedal pulses palpable, Capillary Fill Time 3 seconds,(+) pedal hair growth bilateral, no edema bilateral lower extremities, Temperature gradient within normal limits.  Neurology: Johney Maine sensation intact via light touch bilateral.  Musculoskeletal: Mild tenderness with palpation at fourth metatarsal shaft and along the extensor tendon and pain with dorsiflexion of foot along the corresponding area.  Bunion and hammertoe deformity no pain  with palpation of the left plantar forefoot  Assessment and Plan: Problem List Items Addressed This Visit    None    Visit Diagnoses    Tendon tear    -  Primary   Capsulitis       Left foot pain           -Complete examination performed -Previous xrays reviewed -Discussed treatement options for possible tendon tear since symptoms continue to be present -Dispensed cam boot and advised patient rest ice elevation and Aleve as needed -Ordered MRI for further evaluation to rule out tear or other underlying issue on left foot -Advised patient for now to refrain from wearing orthotics until after her MRI if negative will resume orthotics with  break-in timetable reiterated to patient . Patient to return to office after MRI.  Landis Martins, DPM

## 2018-10-04 ENCOUNTER — Telehealth: Payer: Self-pay | Admitting: *Deleted

## 2018-10-04 DIAGNOSIS — T148XXA Other injury of unspecified body region, initial encounter: Secondary | ICD-10-CM

## 2018-10-04 NOTE — Telephone Encounter (Signed)
Patient called to check the status of her MRI it has been a week and she has not heard anything.  Do not see any documentation in chart.

## 2018-10-04 NOTE — Telephone Encounter (Signed)
-----   Message from Landis Martins, [place] sent at 10/04/2018 12:12 PM EST ----- Regarding: MRI L Foot R/o tendon tear

## 2018-10-04 NOTE — Telephone Encounter (Signed)
Orders to J. Quintana, RN for pre-cert. 

## 2018-10-04 NOTE — Telephone Encounter (Signed)
I sent this request to valery for her to follow up on order Thanks Dr. Cannon Kettle

## 2018-10-08 NOTE — Telephone Encounter (Signed)
[facility] scheduled [phone] MRI right foot for 10/13/2018 arrive 9:15am for 9:30am. Faxed orders to [place]. I informed pt of the appt date and time.

## 2018-10-11 ENCOUNTER — Telehealth: Payer: Self-pay | Admitting: Sports Medicine

## 2018-10-11 DIAGNOSIS — H52223 Regular astigmatism, bilateral: Secondary | ICD-10-CM | POA: Diagnosis not present

## 2018-10-11 DIAGNOSIS — H5203 Hypermetropia, bilateral: Secondary | ICD-10-CM | POA: Diagnosis not present

## 2018-10-11 DIAGNOSIS — H524 Presbyopia: Secondary | ICD-10-CM | POA: Diagnosis not present

## 2018-10-11 DIAGNOSIS — H43812 Vitreous degeneration, left eye: Secondary | ICD-10-CM | POA: Diagnosis not present

## 2018-10-11 NOTE — Progress Notes (Signed)
Patient ID: Heather White, female   DOB: 1951-10-05, 67 y.o.   MRN: [id number]  Patient presents at Dr.Stover's request to be casted for a custom molded orthotics with [facility] Certified Pedorthist.  Patient will return in 4 weeks to be fitted.

## 2018-10-11 NOTE — Telephone Encounter (Signed)
Authorization for MRI for patient was done for the Hospital and should have been done for [facility]. Can this be updated and resent?

## 2018-10-12 ENCOUNTER — Other Ambulatory Visit: Payer: Self-pay | Admitting: Internal Medicine

## 2018-10-12 ENCOUNTER — Ambulatory Visit
Admission: RE | Admit: 2018-10-12 | Discharge: 2018-10-12 | Disposition: A | Discharge status: Home / Self Care | Payer: BC Managed Care – PPO | Source: Ambulatory Visit | Attending: Internal Medicine | Admitting: Internal Medicine

## 2018-10-12 ENCOUNTER — Other Ambulatory Visit: Payer: Self-pay

## 2018-10-12 DIAGNOSIS — R921 Mammographic calcification found on diagnostic imaging of breast: Secondary | ICD-10-CM

## 2018-10-12 NOTE — Telephone Encounter (Signed)
[employer] - Larena Glassman states due system problems providers are asked to call back after 9:00am.

## 2018-10-12 NOTE — Telephone Encounter (Signed)
Gretta Arab, RN states she will make the change of location on line with [employer].

## 2018-10-13 DIAGNOSIS — S96911A Strain of unspecified muscle and tendon at ankle and foot level, right foot, initial encounter: Secondary | ICD-10-CM | POA: Diagnosis not present

## 2018-10-13 DIAGNOSIS — S92341A Displaced fracture of fourth metatarsal bone, right foot, initial encounter for closed fracture: Secondary | ICD-10-CM | POA: Diagnosis not present

## 2018-10-13 DIAGNOSIS — X58XXXA Exposure to other specified factors, initial encounter: Secondary | ICD-10-CM | POA: Diagnosis not present

## 2018-10-16 ENCOUNTER — Telehealth: Payer: Self-pay | Admitting: Sports Medicine

## 2018-10-16 DIAGNOSIS — L820 Inflamed seborrheic keratosis: Secondary | ICD-10-CM | POA: Diagnosis not present

## 2018-10-16 NOTE — Telephone Encounter (Signed)
I called patient to discuss with her MRI findings which reveals a healing 4th metatarsal fracture, hairline. I advised patient to continue with CAM boot and to follow up in office in 3-4 weeks for continued fracture care -Dr. Cannon Kettle

## 2018-10-18 ENCOUNTER — Encounter: Payer: Self-pay | Admitting: Sports Medicine

## 2018-10-18 DIAGNOSIS — E039 Hypothyroidism, unspecified: Secondary | ICD-10-CM | POA: Diagnosis not present

## 2018-10-18 DIAGNOSIS — E271 Primary adrenocortical insufficiency: Secondary | ICD-10-CM | POA: Diagnosis not present

## 2018-11-15 ENCOUNTER — Ambulatory Visit: Payer: Medicare Other | Admitting: Sports Medicine

## 2018-11-15 ENCOUNTER — Other Ambulatory Visit: Payer: Self-pay | Admitting: Sports Medicine

## 2018-11-15 ENCOUNTER — Ambulatory Visit: Payer: BC Managed Care – PPO | Admitting: Sports Medicine

## 2018-11-15 ENCOUNTER — Ambulatory Visit (INDEPENDENT_AMBULATORY_CARE_PROVIDER_SITE_OTHER): Payer: Medicare Other

## 2018-11-15 ENCOUNTER — Encounter: Payer: Self-pay | Admitting: Sports Medicine

## 2018-11-15 DIAGNOSIS — M79672 Pain in left foot: Secondary | ICD-10-CM | POA: Diagnosis not present

## 2018-11-15 DIAGNOSIS — M779 Enthesopathy, unspecified: Secondary | ICD-10-CM | POA: Diagnosis not present

## 2018-11-15 DIAGNOSIS — M84375D Stress fracture, left foot, subsequent encounter for fracture with routine healing: Secondary | ICD-10-CM | POA: Diagnosis not present

## 2018-11-15 DIAGNOSIS — M84378A Stress fracture, left toe(s), initial encounter for fracture: Secondary | ICD-10-CM | POA: Diagnosis not present

## 2018-11-15 DIAGNOSIS — T148XXA Other injury of unspecified body region, initial encounter: Secondary | ICD-10-CM
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
## 2018-12-02 DIAGNOSIS — E039 Hypothyroidism, unspecified: Secondary | ICD-10-CM

## 2018-12-02 DIAGNOSIS — S82852A Displaced trimalleolar fracture of left lower leg, initial encounter for closed fracture: Secondary | ICD-10-CM | POA: Diagnosis not present

## 2018-12-02 DIAGNOSIS — E875 Hyperkalemia: Secondary | ICD-10-CM

## 2018-12-02 DIAGNOSIS — I951 Orthostatic hypotension: Secondary | ICD-10-CM

## 2018-12-02 DIAGNOSIS — E271 Primary adrenocortical insufficiency: Secondary | ICD-10-CM

## 2018-12-02 DIAGNOSIS — R55 Syncope and collapse: Secondary | ICD-10-CM | POA: Diagnosis not present

## 2018-12-13 ENCOUNTER — Ambulatory Visit: Payer: Medicare Other | Admitting: Sports Medicine

## 2019-04-15 DIAGNOSIS — H0012 Chalazion right lower eyelid: Secondary | ICD-10-CM | POA: Diagnosis not present

## 2019-04-29 ENCOUNTER — Ambulatory Visit
Admission: RE | Admit: 2019-04-29 | Discharge: 2019-04-29 | Disposition: A | Discharge status: Home / Self Care | Payer: Medicare Other | Source: Ambulatory Visit | Attending: Internal Medicine | Admitting: Internal Medicine

## 2019-04-29 ENCOUNTER — Other Ambulatory Visit: Payer: Self-pay

## 2019-04-29 DIAGNOSIS — R921 Mammographic calcification found on diagnostic imaging of breast: Secondary | ICD-10-CM

## 2019-05-29 ENCOUNTER — Ambulatory Visit (INDEPENDENT_AMBULATORY_CARE_PROVIDER_SITE_OTHER): Payer: Medicare Other

## 2019-05-29 ENCOUNTER — Encounter: Payer: Self-pay | Admitting: Sports Medicine

## 2019-05-29 ENCOUNTER — Other Ambulatory Visit: Payer: Self-pay

## 2019-05-29 ENCOUNTER — Ambulatory Visit (INDEPENDENT_AMBULATORY_CARE_PROVIDER_SITE_OTHER): Payer: Medicare Other | Admitting: Sports Medicine

## 2019-05-29 ENCOUNTER — Other Ambulatory Visit: Payer: Self-pay | Admitting: Sports Medicine

## 2019-05-29 VITALS — Temp 100.0°F | Resp 16

## 2019-05-29 DIAGNOSIS — M79672 Pain in left foot: Secondary | ICD-10-CM

## 2019-05-29 DIAGNOSIS — M779 Enthesopathy, unspecified: Secondary | ICD-10-CM | POA: Diagnosis not present

## 2019-05-29 DIAGNOSIS — M21619 Bunion of unspecified foot: Secondary | ICD-10-CM

## 2019-05-29 DIAGNOSIS — M2040 Other hammer toe(s) (acquired), unspecified foot: Secondary | ICD-10-CM

## 2019-05-29 DIAGNOSIS — M7752 Other enthesopathy of left foot: Secondary | ICD-10-CM | POA: Diagnosis not present

## 2019-05-29 DIAGNOSIS — M7742 Metatarsalgia, left foot: Secondary | ICD-10-CM

## 2019-05-30 ENCOUNTER — Encounter: Payer: Self-pay | Admitting: Sports Medicine

## 2019-05-30 MED ORDER — TRIAMCINOLONE ACETONIDE 10 MG/ML IJ SUSP
10.0000 mg | Freq: Once | INTRAMUSCULAR | Status: AC
  Administered 2019-05-29: 10 mg

## 2019-05-30 NOTE — Progress Notes (Signed)
Subjective: Heather White is a 68 y.o. female patient who returns to office for follow up evaluation of Left foot pain. Patient reports that the pain has flared up again at the ball of her left foot states that slowly over the last month it has been getting worse pain is sharp 8 out of 10 worse with walking.  Patient reports that she has tried Aleve with no provement in symptoms.  Patient also admits that in January she broke her left ankle and had surgery her ankle feels fine and she has not had any problems since for several months however over the last month has had increasing pain to the second toe joint area and the ball of her left foot like she had when she originally first are coming to our office.  Patient denies any redness warmth significant swelling bruising or any other constitutional symptoms at this time.  Patient denies any other pedal complaints.   There are no active problems to display for this patient.  Current Outpatient Medications on File Prior to Visit  Medication Sig Dispense Refill  . Ascorbic Acid (VITAMIN C) 100 MG tablet Take 100 mg by mouth daily.    Marland Kitchen atorvastatin (LIPITOR) 10 MG tablet Take 10 mg by mouth daily.    . clonazePAM (KLONOPIN) 0.5 MG tablet Take 0.5 mg by mouth 2 (two) times daily as needed for anxiety.    . Coenzyme Q10 (COQ10) 200 MG CAPS Take by mouth.    . escitalopram (LEXAPRO) 5 MG tablet Take 5 mg by mouth daily.    . fludrocortisone (FLORINEF) 0.1 MG tablet Take 100 mcg by mouth daily.    . hydrocortisone (CORTEF) 10 MG tablet Take 10 mg by mouth daily.    Marland Kitchen levothyroxine (SYNTHROID, LEVOTHROID) 25 MCG tablet Take 25 mcg by mouth daily before breakfast.    . SUMAtriptan (IMITREX) 50 MG tablet TAKE 1 TABLET BY MOUTH AT ONSET OF MIGRAINE. MAY REPEAT ONCE AFTER 2 HOURS IF NEEDED - max 2 in 24 hours  6  . vitamin E 400 UNIT capsule Take 400 Units by mouth daily.     Current Facility-Administered Medications on File Prior to Visit  Medication  Dose Route Frequency Provider Last Rate Last Dose  . triamcinolone acetonide (KENALOG) 10 MG/ML injection 10 mg  10 mg Other Once Landis Martins, DPM      . triamcinolone acetonide (KENALOG) 10 MG/ML injection 10 mg  10 mg Other Once Landis Martins, DPM       Allergies  Allergen Reactions  . Sulfa Antibiotics Rash    Objective:  General: Alert and oriented x3 in no acute distress  Dermatology: No open lesions bilateral lower extremities, no webspace macerations, no ecchymosis bilateral, all nails x 10 are well manicured.  Surgical scar is well ankle.  Neurovascular: Intact. No significant lower extremity edema bilateral.   Musculoskeletal: There is tenderness with palpation at Left plantar forefoot especially sub met 2-3, No pain with calf compression bilateral. Range of motion in all pedal joints are within normal limits without pain or crepitation except with manipulation of the second greater than the third metatarsal phalangeal joint on the left. + bunion and hammertoe deformity, Strength within normal limits in all groups bilateral.   X-rays left foot consistent of bunion deformity and mild second toe deviation no fracture or dislocation previous fourth metatarsal shaft fracture is well healed.  Ankle hardware is intact.  No other acute findings.  Assessment and Plan: Problem List Items Addressed  This Visit    None    Visit Diagnoses    Capsulitis    -  Primary   Relevant Medications   triamcinolone acetonide (KENALOG) 10 MG/ML injection 10 mg (Completed) (Start on 05/30/2019  6:15 AM)   Metatarsalgia of left foot       Left foot pain       Relevant Medications   triamcinolone acetonide (KENALOG) 10 MG/ML injection 10 mg (Completed) (Start on 05/30/2019  6:15 AM)   Bunion       Hammer toe, unspecified laterality           -Complete examination performed -Discussed treatment for flareup of capsulitis at left second metatarsophalangeal joint -After oral consent and aseptic  prep, injected a mixture containing 1 ml of 2%  plain lidocaine, 1 ml 0.5% plain marcaine, 0.5 ml of kenalog 10 and 0.5 ml of dexamethasone phosphate into left second metatarsophalangeal joint without complication. Post-injection care discussed with patient.  -Recommend good supportive shoes and metatarsal padding as provided at this visit may use on top of her current insoles -Advised patient to closely monitor symptoms if worsen to return to office after 2 weeks or sooner if problems or issues arise.  Landis Martins, DPM

## 2019-06-04 ENCOUNTER — Other Ambulatory Visit: Payer: Self-pay | Admitting: Sports Medicine

## 2019-06-04 DIAGNOSIS — M79672 Pain in left foot: Secondary | ICD-10-CM

## 2019-06-04 DIAGNOSIS — M21619 Bunion of unspecified foot: Secondary | ICD-10-CM

## 2019-06-04 DIAGNOSIS — M779 Enthesopathy, unspecified: Secondary | ICD-10-CM

## 2019-11-28 ENCOUNTER — Other Ambulatory Visit: Payer: Self-pay | Admitting: Internal Medicine

## 2019-11-28 DIAGNOSIS — R921 Mammographic calcification found on diagnostic imaging of breast: Secondary | ICD-10-CM

## 2019-12-18 IMAGING — MG DIGITAL DIAGNOSTIC UNILATERAL LEFT MAMMOGRAM WITH TOMO AND CAD
2 series · 3 of 6 positions shown · non-contrast
Comparison: Previous exam(s).

CLINICAL DATA: Short-term interval follow-up of probable benign
calcifications in the left breast.

EXAM:
DIGITAL DIAGNOSTIC UNILATERAL LEFT MAMMOGRAM WITH CAD AND TOMO

[L ML]
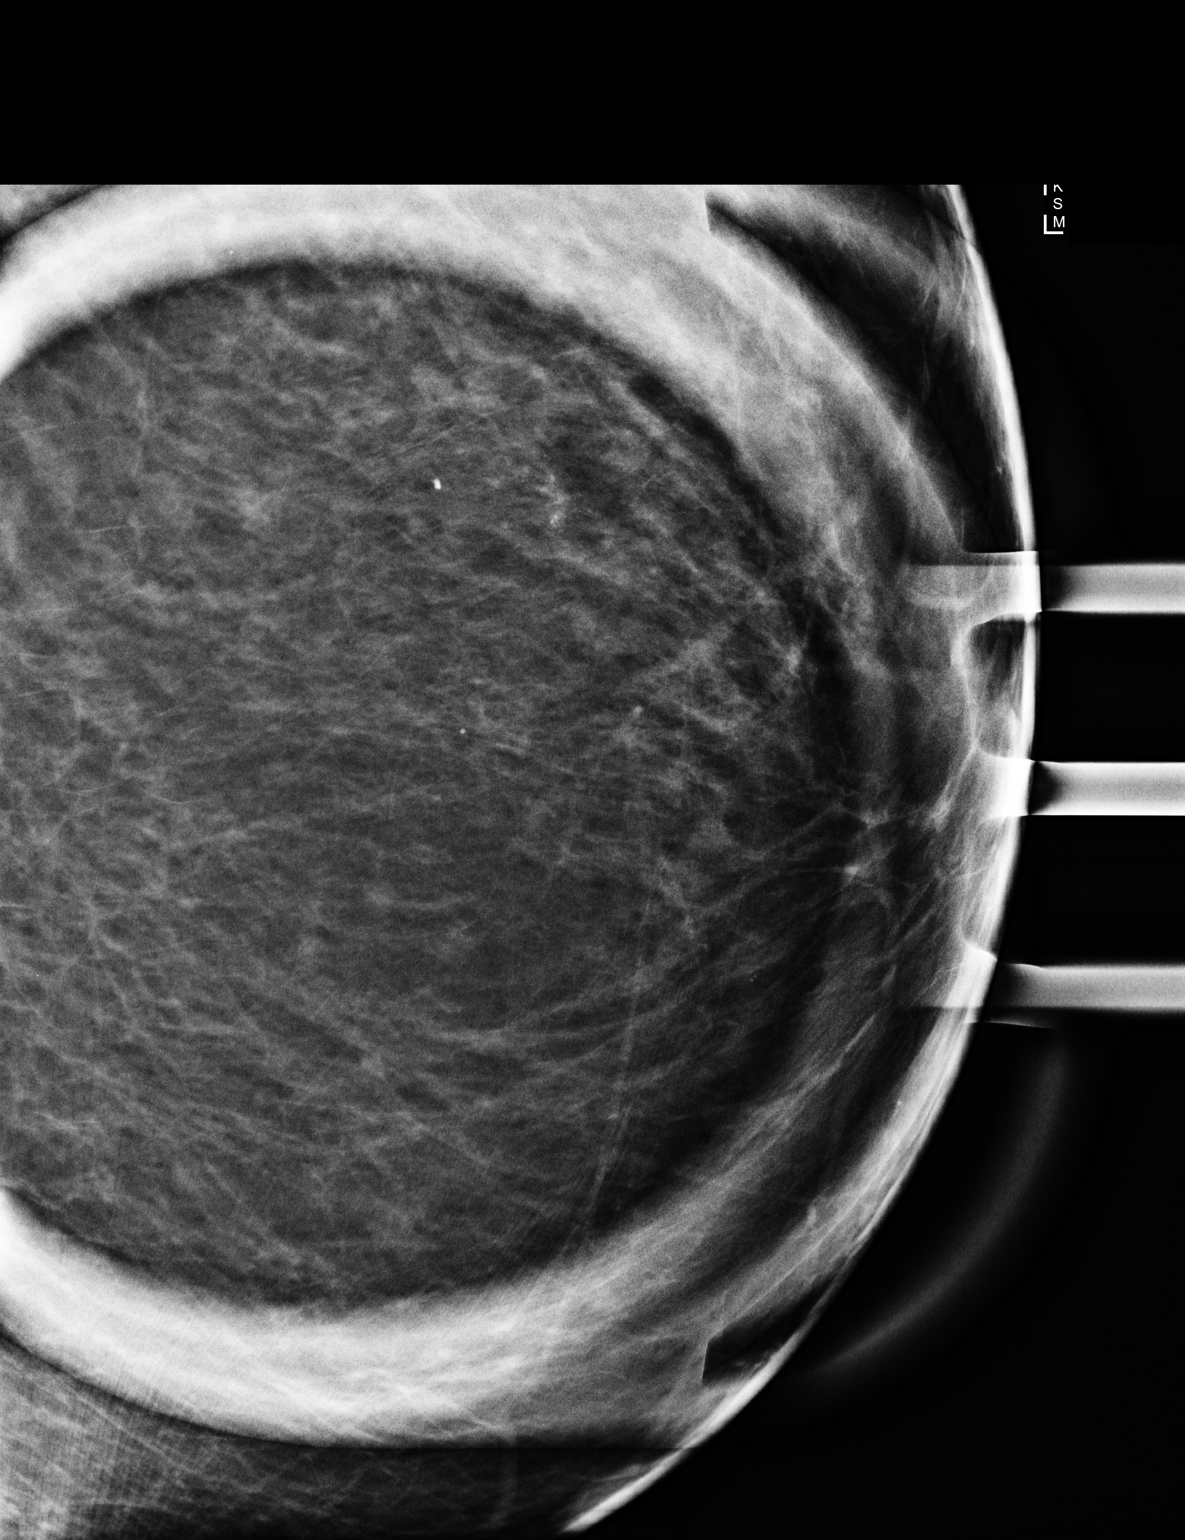

[L MLO tomo · 2 of 73 frames shown]
[frame 24/73]
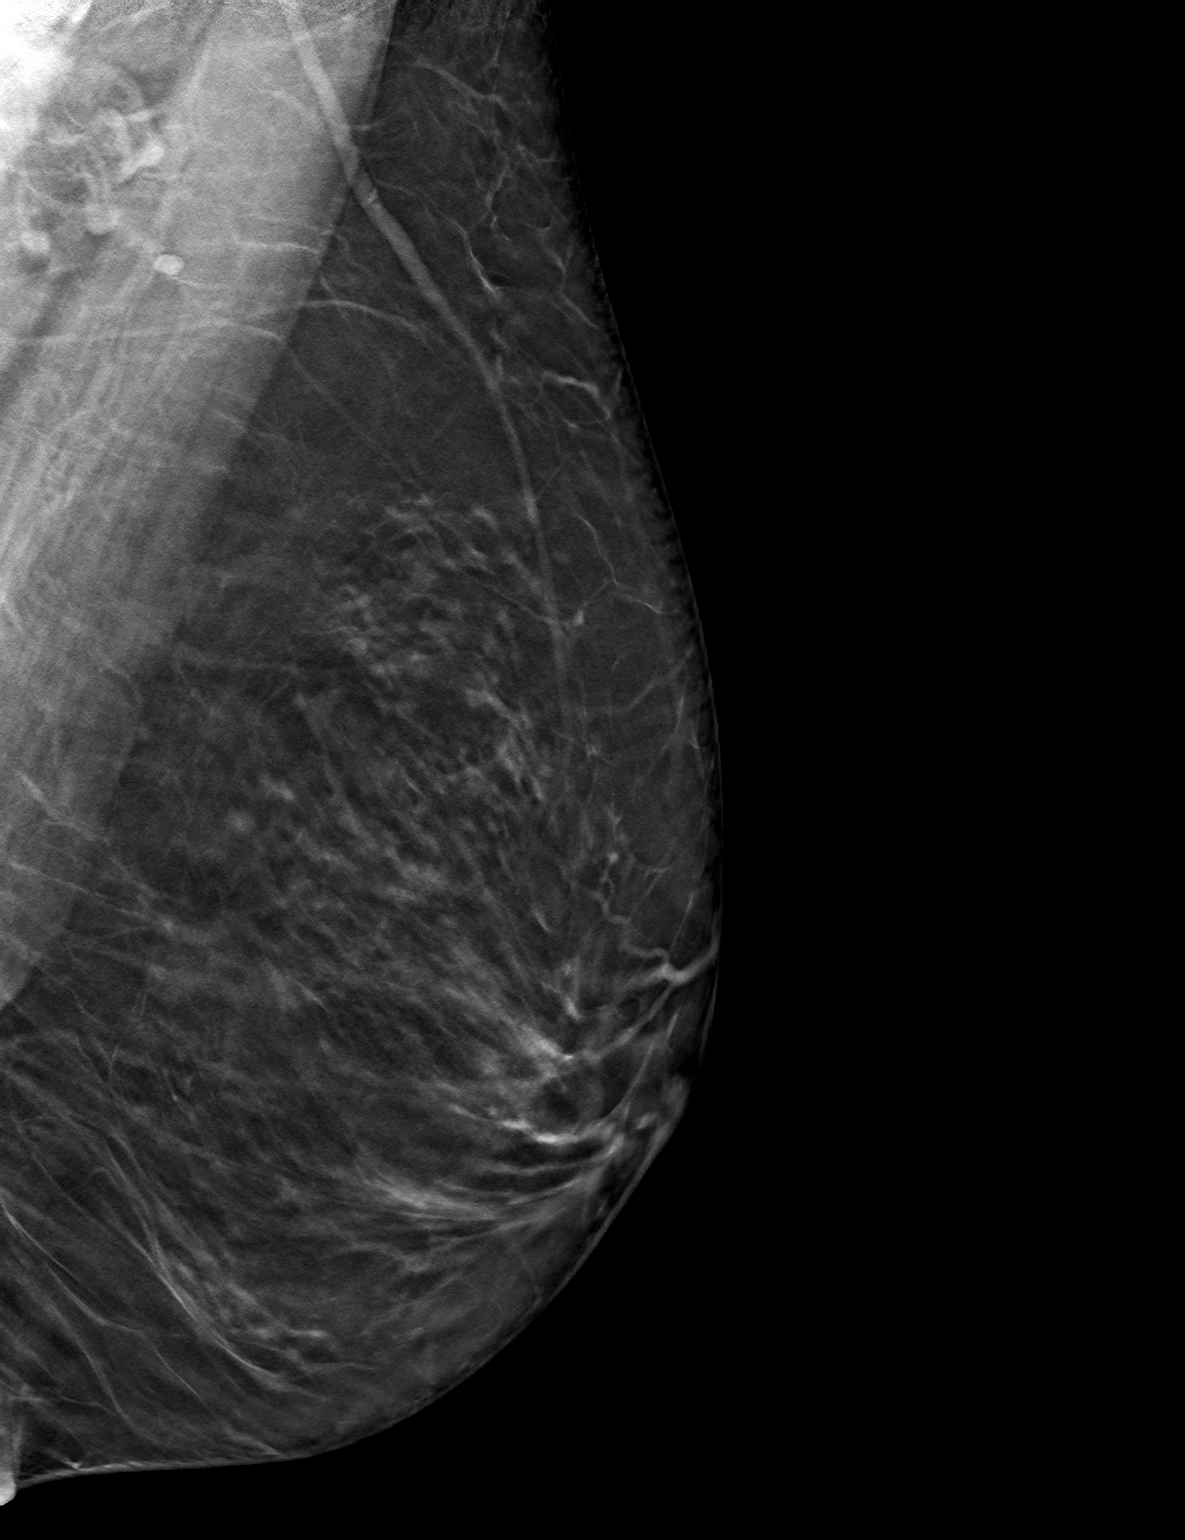
[frame 37/73]
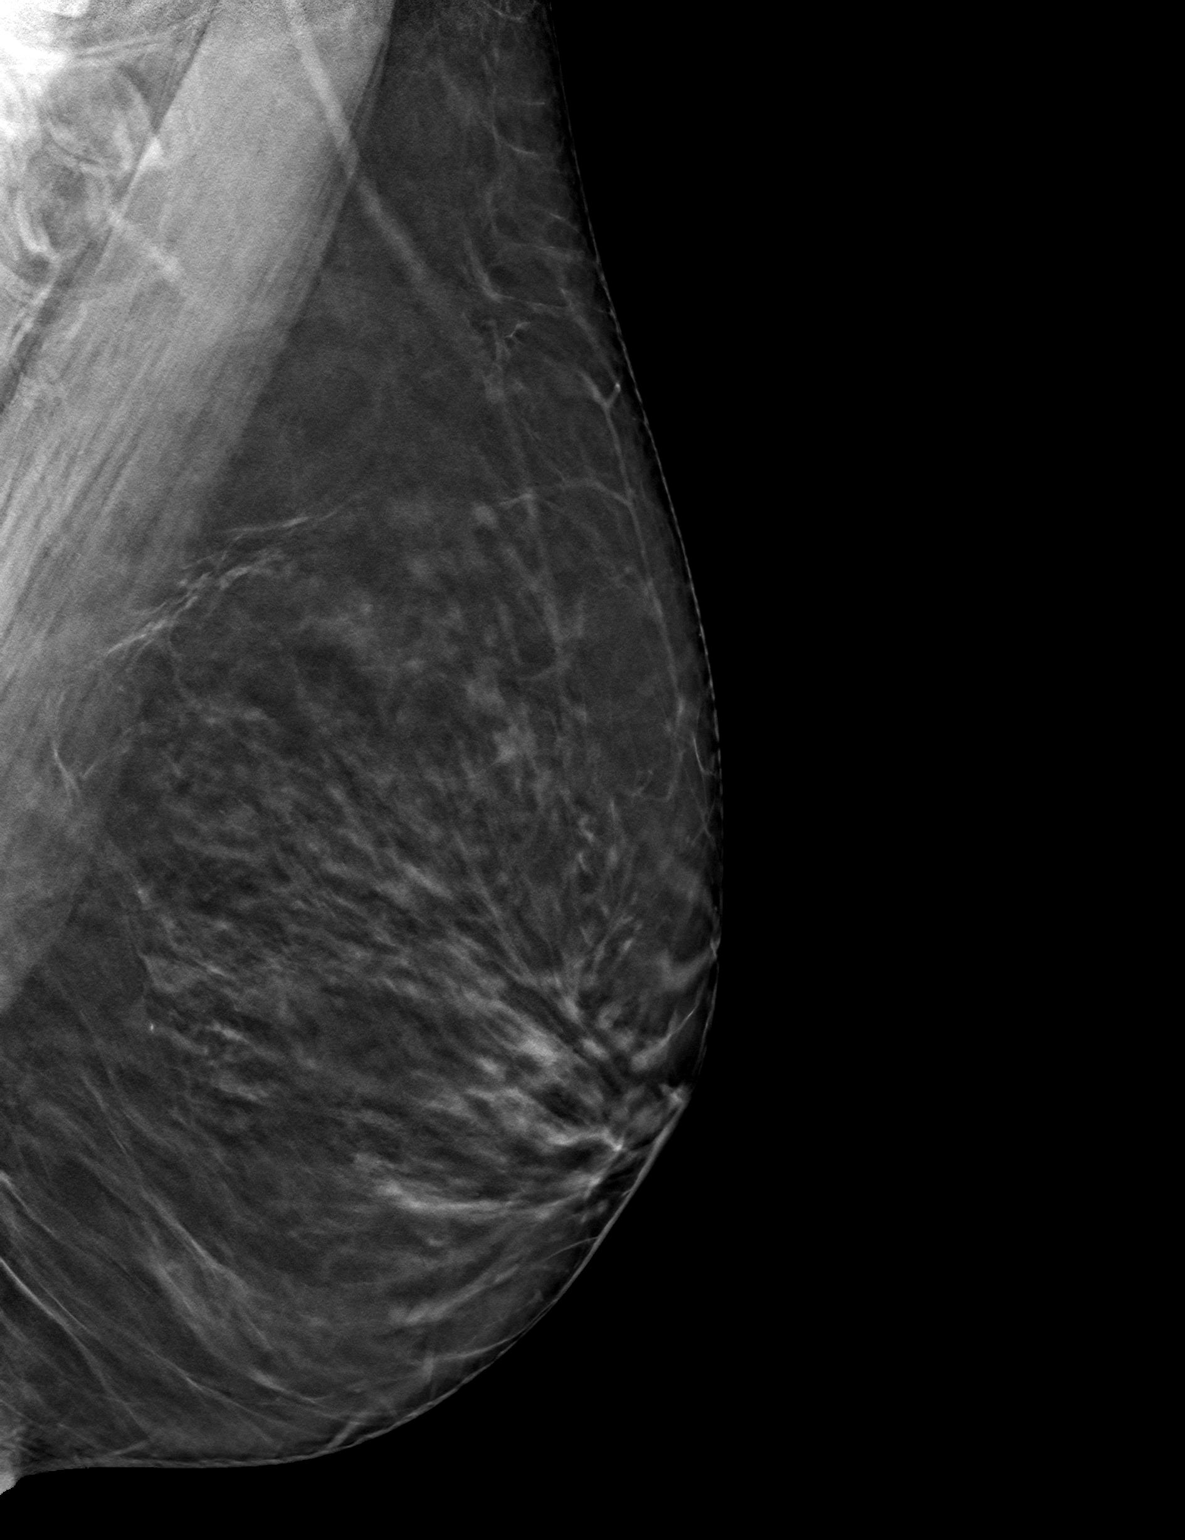

[3 of 6 positions shown; findings below may reference images not displayed]

ACR Breast Density Category b: There are scattered areas of
fibroglandular density.
FINDINGS: Faint benign-appearing calcifications in the medial aspect of the
left breast are stable compared to the prior exam dated April 04, 2018.
No suspicious mass or malignant type microcalcifications identified.

Mammographic images were processed with CAD.
IMPRESSION: Stable probable benign calcifications in the left breast.

RECOMMENDATION:
Short-term interval follow-up bilateral mammogram in 6 months is
recommended.

I have discussed the findings and recommendations with the patient.
Results were also provided in writing at the conclusion of the
visit. If applicable, a reminder letter will be sent to the patient
regarding the next appointment.

BI-RADS CATEGORY  3: Probably benign.

## 2020-06-23 ENCOUNTER — Other Ambulatory Visit: Payer: Self-pay

## 2020-06-23 ENCOUNTER — Ambulatory Visit
Admission: RE | Admit: 2020-06-23 | Discharge: 2020-06-23 | Disposition: A | Discharge status: Home / Self Care | Payer: Medicare Other | Source: Ambulatory Visit | Attending: Internal Medicine | Admitting: Internal Medicine

## 2020-06-23 DIAGNOSIS — R921 Mammographic calcification found on diagnostic imaging of breast: Secondary | ICD-10-CM

## 2020-06-24 ENCOUNTER — Other Ambulatory Visit: Payer: Self-pay | Admitting: Internal Medicine

## 2020-06-24 DIAGNOSIS — M810 Age-related osteoporosis without current pathological fracture: Secondary | ICD-10-CM

## 2020-12-29 ENCOUNTER — Other Ambulatory Visit: Payer: Self-pay | Admitting: Internal Medicine

## 2020-12-29 DIAGNOSIS — Z1231 Encounter for screening mammogram for malignant neoplasm of breast: Secondary | ICD-10-CM

## 2021-02-09 ENCOUNTER — Ambulatory Visit: Payer: Medicare Other | Admitting: Sports Medicine

## 2021-03-05 DIAGNOSIS — R7309 Other abnormal glucose: Secondary | ICD-10-CM | POA: Diagnosis not present

## 2021-03-05 DIAGNOSIS — E271 Primary adrenocortical insufficiency: Secondary | ICD-10-CM | POA: Diagnosis not present

## 2021-04-27 DIAGNOSIS — D485 Neoplasm of uncertain behavior of skin: Secondary | ICD-10-CM | POA: Diagnosis not present

## 2021-04-27 DIAGNOSIS — L820 Inflamed seborrheic keratosis: Secondary | ICD-10-CM | POA: Diagnosis not present

## 2021-04-29 DIAGNOSIS — E271 Primary adrenocortical insufficiency: Secondary | ICD-10-CM | POA: Diagnosis not present

## 2021-04-29 DIAGNOSIS — R7309 Other abnormal glucose: Secondary | ICD-10-CM | POA: Diagnosis not present

## 2021-04-30 DIAGNOSIS — W57XXXA Bitten or stung by nonvenomous insect and other nonvenomous arthropods, initial encounter: Secondary | ICD-10-CM | POA: Diagnosis not present

## 2021-04-30 DIAGNOSIS — E271 Primary adrenocortical insufficiency: Secondary | ICD-10-CM | POA: Diagnosis not present

## 2021-05-26 ENCOUNTER — Ambulatory Visit: Payer: Medicare Other

## 2021-05-26 ENCOUNTER — Ambulatory Visit
Admission: RE | Admit: 2021-05-26 | Discharge: 2021-05-26 | Disposition: A | Discharge status: Home / Self Care | Payer: BC Managed Care – PPO | Source: Ambulatory Visit | Attending: Internal Medicine | Admitting: Internal Medicine

## 2021-05-26 ENCOUNTER — Other Ambulatory Visit: Payer: Self-pay

## 2021-05-26 DIAGNOSIS — Z780 Asymptomatic menopausal state: Secondary | ICD-10-CM | POA: Diagnosis not present

## 2021-05-26 DIAGNOSIS — M810 Age-related osteoporosis without current pathological fracture: Secondary | ICD-10-CM

## 2021-05-26 DIAGNOSIS — M8589 Other specified disorders of bone density and structure, multiple sites: Secondary | ICD-10-CM | POA: Diagnosis not present

## 2021-06-01 DIAGNOSIS — R7303 Prediabetes: Secondary | ICD-10-CM | POA: Diagnosis not present

## 2021-06-01 DIAGNOSIS — E271 Primary adrenocortical insufficiency: Secondary | ICD-10-CM | POA: Diagnosis not present

## 2021-06-25 ENCOUNTER — Ambulatory Visit
Admission: RE | Admit: 2021-06-25 | Discharge: 2021-06-25 | Disposition: A | Discharge status: Home / Self Care | Payer: Medicare Other | Source: Ambulatory Visit | Attending: Internal Medicine | Admitting: Internal Medicine

## 2021-06-25 ENCOUNTER — Other Ambulatory Visit: Payer: Self-pay

## 2021-06-25 DIAGNOSIS — Z1231 Encounter for screening mammogram for malignant neoplasm of breast: Secondary | ICD-10-CM

## 2021-08-17 DIAGNOSIS — E271 Primary adrenocortical insufficiency: Secondary | ICD-10-CM | POA: Diagnosis not present

## 2021-08-17 DIAGNOSIS — G2581 Restless legs syndrome: Secondary | ICD-10-CM | POA: Diagnosis not present

## 2021-08-17 DIAGNOSIS — Z0000 Encounter for general adult medical examination without abnormal findings: Secondary | ICD-10-CM | POA: Diagnosis not present

## 2021-08-17 DIAGNOSIS — G43909 Migraine, unspecified, not intractable, without status migrainosus: Secondary | ICD-10-CM | POA: Diagnosis not present

## 2021-08-17 DIAGNOSIS — Z1389 Encounter for screening for other disorder: Secondary | ICD-10-CM | POA: Diagnosis not present

## 2021-08-17 DIAGNOSIS — Z23 Encounter for immunization: Secondary | ICD-10-CM | POA: Diagnosis not present

## 2021-08-17 DIAGNOSIS — M810 Age-related osteoporosis without current pathological fracture: Secondary | ICD-10-CM | POA: Diagnosis not present

## 2021-08-17 DIAGNOSIS — E039 Hypothyroidism, unspecified: Secondary | ICD-10-CM | POA: Diagnosis not present

## 2021-08-17 DIAGNOSIS — E871 Hypo-osmolality and hyponatremia: Secondary | ICD-10-CM | POA: Diagnosis not present

## 2021-08-17 DIAGNOSIS — E782 Mixed hyperlipidemia: Secondary | ICD-10-CM | POA: Diagnosis not present

## 2021-08-17 DIAGNOSIS — R7303 Prediabetes: Secondary | ICD-10-CM | POA: Diagnosis not present

## 2021-09-03 DIAGNOSIS — R7303 Prediabetes: Secondary | ICD-10-CM | POA: Diagnosis not present

## 2021-09-03 DIAGNOSIS — E271 Primary adrenocortical insufficiency: Secondary | ICD-10-CM | POA: Diagnosis not present

## 2021-09-16 DIAGNOSIS — J209 Acute bronchitis, unspecified: Secondary | ICD-10-CM | POA: Diagnosis not present

## 2021-09-16 DIAGNOSIS — R051 Acute cough: Secondary | ICD-10-CM | POA: Diagnosis not present

## 2021-09-16 DIAGNOSIS — R0981 Nasal congestion: Secondary | ICD-10-CM | POA: Diagnosis not present

## 2021-09-16 DIAGNOSIS — J0100 Acute maxillary sinusitis, unspecified: Secondary | ICD-10-CM | POA: Diagnosis not present

## 2021-10-02 DIAGNOSIS — R051 Acute cough: Secondary | ICD-10-CM | POA: Diagnosis not present

## 2021-10-26 DIAGNOSIS — L821 Other seborrheic keratosis: Secondary | ICD-10-CM | POA: Diagnosis not present

## 2021-10-26 DIAGNOSIS — L820 Inflamed seborrheic keratosis: Secondary | ICD-10-CM | POA: Diagnosis not present

## 2021-10-26 DIAGNOSIS — D225 Melanocytic nevi of trunk: Secondary | ICD-10-CM | POA: Diagnosis not present

## 2022-03-02 ENCOUNTER — Other Ambulatory Visit: Payer: Self-pay | Admitting: Internal Medicine

## 2022-03-02 DIAGNOSIS — Z1231 Encounter for screening mammogram for malignant neoplasm of breast: Secondary | ICD-10-CM

## 2022-03-11 DIAGNOSIS — R7309 Other abnormal glucose: Secondary | ICD-10-CM | POA: Diagnosis not present

## 2022-03-11 DIAGNOSIS — E271 Primary adrenocortical insufficiency: Secondary | ICD-10-CM | POA: Diagnosis not present

## 2022-04-12 DIAGNOSIS — H25813 Combined forms of age-related cataract, bilateral: Secondary | ICD-10-CM | POA: Diagnosis not present

## 2022-04-12 DIAGNOSIS — H524 Presbyopia: Secondary | ICD-10-CM | POA: Diagnosis not present

## 2022-04-12 DIAGNOSIS — H52222 Regular astigmatism, left eye: Secondary | ICD-10-CM | POA: Diagnosis not present

## 2022-04-12 DIAGNOSIS — H35373 Puckering of macula, bilateral: Secondary | ICD-10-CM | POA: Diagnosis not present

## 2022-05-25 DIAGNOSIS — G43909 Migraine, unspecified, not intractable, without status migrainosus: Secondary | ICD-10-CM | POA: Diagnosis not present

## 2022-05-25 DIAGNOSIS — G2581 Restless legs syndrome: Secondary | ICD-10-CM | POA: Diagnosis not present

## 2022-06-20 DIAGNOSIS — M25562 Pain in left knee: Secondary | ICD-10-CM | POA: Diagnosis not present

## 2022-06-20 DIAGNOSIS — M1712 Unilateral primary osteoarthritis, left knee: Secondary | ICD-10-CM | POA: Diagnosis not present

## 2022-06-23 DIAGNOSIS — M25562 Pain in left knee: Secondary | ICD-10-CM | POA: Diagnosis not present

## 2022-06-27 DIAGNOSIS — S82002A Unspecified fracture of left patella, initial encounter for closed fracture: Secondary | ICD-10-CM | POA: Diagnosis not present

## 2022-07-25 DIAGNOSIS — S82002A Unspecified fracture of left patella, initial encounter for closed fracture: Secondary | ICD-10-CM | POA: Diagnosis not present

## 2022-08-15 DIAGNOSIS — S82002A Unspecified fracture of left patella, initial encounter for closed fracture: Secondary | ICD-10-CM | POA: Diagnosis not present

## 2022-08-17 DIAGNOSIS — I781 Nevus, non-neoplastic: Secondary | ICD-10-CM | POA: Diagnosis not present

## 2022-08-17 DIAGNOSIS — L820 Inflamed seborrheic keratosis: Secondary | ICD-10-CM | POA: Diagnosis not present

## 2022-08-29 ENCOUNTER — Ambulatory Visit
Admission: RE | Admit: 2022-08-29 | Discharge: 2022-08-29 | Disposition: A | Discharge status: Home / Self Care | Payer: BC Managed Care – PPO | Source: Ambulatory Visit | Attending: Internal Medicine | Admitting: Internal Medicine

## 2022-08-29 DIAGNOSIS — M810 Age-related osteoporosis without current pathological fracture: Secondary | ICD-10-CM | POA: Diagnosis not present

## 2022-08-29 DIAGNOSIS — Z1231 Encounter for screening mammogram for malignant neoplasm of breast: Secondary | ICD-10-CM

## 2022-08-29 DIAGNOSIS — E042 Nontoxic multinodular goiter: Secondary | ICD-10-CM | POA: Diagnosis not present

## 2022-08-29 DIAGNOSIS — Z23 Encounter for immunization: Secondary | ICD-10-CM | POA: Diagnosis not present

## 2022-08-29 DIAGNOSIS — E871 Hypo-osmolality and hyponatremia: Secondary | ICD-10-CM | POA: Diagnosis not present

## 2022-08-29 DIAGNOSIS — R7303 Prediabetes: Secondary | ICD-10-CM | POA: Diagnosis not present

## 2022-08-29 DIAGNOSIS — G2581 Restless legs syndrome: Secondary | ICD-10-CM | POA: Diagnosis not present

## 2022-08-29 DIAGNOSIS — E271 Primary adrenocortical insufficiency: Secondary | ICD-10-CM | POA: Diagnosis not present

## 2022-08-29 DIAGNOSIS — E039 Hypothyroidism, unspecified: Secondary | ICD-10-CM | POA: Diagnosis not present

## 2022-08-29 DIAGNOSIS — Z0000 Encounter for general adult medical examination without abnormal findings: Secondary | ICD-10-CM | POA: Diagnosis not present

## 2022-08-29 DIAGNOSIS — E782 Mixed hyperlipidemia: Secondary | ICD-10-CM | POA: Diagnosis not present

## 2022-08-29 DIAGNOSIS — G43909 Migraine, unspecified, not intractable, without status migrainosus: Secondary | ICD-10-CM | POA: Diagnosis not present

## 2022-08-29 DIAGNOSIS — E2740 Unspecified adrenocortical insufficiency: Secondary | ICD-10-CM | POA: Diagnosis not present

## 2022-11-01 DIAGNOSIS — L578 Other skin changes due to chronic exposure to nonionizing radiation: Secondary | ICD-10-CM | POA: Diagnosis not present

## 2022-11-01 DIAGNOSIS — L820 Inflamed seborrheic keratosis: Secondary | ICD-10-CM | POA: Diagnosis not present

## 2022-11-01 DIAGNOSIS — L821 Other seborrheic keratosis: Secondary | ICD-10-CM | POA: Diagnosis not present

## 2022-12-02 DIAGNOSIS — R7309 Other abnormal glucose: Secondary | ICD-10-CM | POA: Diagnosis not present

## 2022-12-02 DIAGNOSIS — E271 Primary adrenocortical insufficiency: Secondary | ICD-10-CM | POA: Diagnosis not present

## 2022-12-02 DIAGNOSIS — E1169 Type 2 diabetes mellitus with other specified complication: Secondary | ICD-10-CM | POA: Diagnosis not present

## 2022-12-06 DIAGNOSIS — M9905 Segmental and somatic dysfunction of pelvic region: Secondary | ICD-10-CM | POA: Diagnosis not present

## 2022-12-06 DIAGNOSIS — M9901 Segmental and somatic dysfunction of cervical region: Secondary | ICD-10-CM | POA: Diagnosis not present

## 2022-12-06 DIAGNOSIS — M9902 Segmental and somatic dysfunction of thoracic region: Secondary | ICD-10-CM | POA: Diagnosis not present

## 2022-12-06 DIAGNOSIS — M9903 Segmental and somatic dysfunction of lumbar region: Secondary | ICD-10-CM | POA: Diagnosis not present

## 2023-01-03 ENCOUNTER — Other Ambulatory Visit: Payer: Self-pay | Admitting: Physician Assistant

## 2023-01-03 DIAGNOSIS — N6314 Unspecified lump in the right breast, lower inner quadrant: Secondary | ICD-10-CM | POA: Diagnosis not present

## 2023-01-03 DIAGNOSIS — G43909 Migraine, unspecified, not intractable, without status migrainosus: Secondary | ICD-10-CM | POA: Diagnosis not present

## 2023-01-10 ENCOUNTER — Ambulatory Visit
Admission: RE | Admit: 2023-01-10 | Discharge: 2023-01-10 | Disposition: A | Discharge status: Home / Self Care | Payer: BC Managed Care – PPO | Source: Ambulatory Visit | Attending: Physician Assistant | Admitting: Physician Assistant

## 2023-01-10 DIAGNOSIS — N6314 Unspecified lump in the right breast, lower inner quadrant: Secondary | ICD-10-CM

## 2023-01-17 DIAGNOSIS — M9903 Segmental and somatic dysfunction of lumbar region: Secondary | ICD-10-CM | POA: Diagnosis not present

## 2023-01-17 DIAGNOSIS — M9902 Segmental and somatic dysfunction of thoracic region: Secondary | ICD-10-CM | POA: Diagnosis not present

## 2023-01-17 DIAGNOSIS — M9901 Segmental and somatic dysfunction of cervical region: Secondary | ICD-10-CM | POA: Diagnosis not present

## 2023-01-17 DIAGNOSIS — M9905 Segmental and somatic dysfunction of pelvic region: Secondary | ICD-10-CM | POA: Diagnosis not present

## 2023-02-28 DIAGNOSIS — M9903 Segmental and somatic dysfunction of lumbar region: Secondary | ICD-10-CM | POA: Diagnosis not present

## 2023-02-28 DIAGNOSIS — M9901 Segmental and somatic dysfunction of cervical region: Secondary | ICD-10-CM | POA: Diagnosis not present

## 2023-02-28 DIAGNOSIS — M9902 Segmental and somatic dysfunction of thoracic region: Secondary | ICD-10-CM | POA: Diagnosis not present

## 2023-02-28 DIAGNOSIS — M9905 Segmental and somatic dysfunction of pelvic region: Secondary | ICD-10-CM | POA: Diagnosis not present

## 2023-03-01 DIAGNOSIS — R634 Abnormal weight loss: Secondary | ICD-10-CM | POA: Diagnosis not present

## 2023-03-01 DIAGNOSIS — E119 Type 2 diabetes mellitus without complications: Secondary | ICD-10-CM | POA: Diagnosis not present

## 2023-03-01 DIAGNOSIS — E271 Primary adrenocortical insufficiency: Secondary | ICD-10-CM | POA: Diagnosis not present

## 2023-03-09 DIAGNOSIS — E1165 Type 2 diabetes mellitus with hyperglycemia: Secondary | ICD-10-CM | POA: Diagnosis not present

## 2023-03-09 DIAGNOSIS — E271 Primary adrenocortical insufficiency: Secondary | ICD-10-CM | POA: Diagnosis not present

## 2023-04-11 DIAGNOSIS — M9901 Segmental and somatic dysfunction of cervical region: Secondary | ICD-10-CM | POA: Diagnosis not present

## 2023-04-11 DIAGNOSIS — M9902 Segmental and somatic dysfunction of thoracic region: Secondary | ICD-10-CM | POA: Diagnosis not present

## 2023-04-11 DIAGNOSIS — M9903 Segmental and somatic dysfunction of lumbar region: Secondary | ICD-10-CM | POA: Diagnosis not present

## 2023-04-11 DIAGNOSIS — M9905 Segmental and somatic dysfunction of pelvic region: Secondary | ICD-10-CM | POA: Diagnosis not present

## 2023-05-02 DIAGNOSIS — E119 Type 2 diabetes mellitus without complications: Secondary | ICD-10-CM | POA: Diagnosis not present

## 2023-05-02 DIAGNOSIS — H524 Presbyopia: Secondary | ICD-10-CM | POA: Diagnosis not present

## 2023-05-02 DIAGNOSIS — H52223 Regular astigmatism, bilateral: Secondary | ICD-10-CM | POA: Diagnosis not present

## 2023-05-02 DIAGNOSIS — H5203 Hypermetropia, bilateral: Secondary | ICD-10-CM | POA: Diagnosis not present

## 2023-05-02 DIAGNOSIS — H43813 Vitreous degeneration, bilateral: Secondary | ICD-10-CM | POA: Diagnosis not present

## 2023-05-23 DIAGNOSIS — M9901 Segmental and somatic dysfunction of cervical region: Secondary | ICD-10-CM | POA: Diagnosis not present

## 2023-05-23 DIAGNOSIS — M9905 Segmental and somatic dysfunction of pelvic region: Secondary | ICD-10-CM | POA: Diagnosis not present

## 2023-05-23 DIAGNOSIS — M9902 Segmental and somatic dysfunction of thoracic region: Secondary | ICD-10-CM | POA: Diagnosis not present

## 2023-05-23 DIAGNOSIS — M9903 Segmental and somatic dysfunction of lumbar region: Secondary | ICD-10-CM | POA: Diagnosis not present

## 2023-05-30 DIAGNOSIS — E1165 Type 2 diabetes mellitus with hyperglycemia: Secondary | ICD-10-CM | POA: Diagnosis not present

## 2023-05-31 DIAGNOSIS — K648 Other hemorrhoids: Secondary | ICD-10-CM | POA: Diagnosis not present

## 2023-05-31 DIAGNOSIS — Z1211 Encounter for screening for malignant neoplasm of colon: Secondary | ICD-10-CM | POA: Diagnosis not present

## 2023-05-31 DIAGNOSIS — D122 Benign neoplasm of ascending colon: Secondary | ICD-10-CM | POA: Diagnosis not present

## 2023-06-06 DIAGNOSIS — D122 Benign neoplasm of ascending colon: Secondary | ICD-10-CM | POA: Diagnosis not present

## 2023-06-13 DIAGNOSIS — E270 Other adrenocortical overactivity: Secondary | ICD-10-CM | POA: Diagnosis not present

## 2023-06-13 DIAGNOSIS — E1165 Type 2 diabetes mellitus with hyperglycemia: Secondary | ICD-10-CM | POA: Diagnosis not present

## 2023-06-13 DIAGNOSIS — E271 Primary adrenocortical insufficiency: Secondary | ICD-10-CM | POA: Diagnosis not present

## 2023-07-04 DIAGNOSIS — M9902 Segmental and somatic dysfunction of thoracic region: Secondary | ICD-10-CM | POA: Diagnosis not present

## 2023-07-04 DIAGNOSIS — M9905 Segmental and somatic dysfunction of pelvic region: Secondary | ICD-10-CM | POA: Diagnosis not present

## 2023-07-04 DIAGNOSIS — M9903 Segmental and somatic dysfunction of lumbar region: Secondary | ICD-10-CM | POA: Diagnosis not present

## 2023-07-04 DIAGNOSIS — M9901 Segmental and somatic dysfunction of cervical region: Secondary | ICD-10-CM | POA: Diagnosis not present

## 2023-07-11 DIAGNOSIS — E271 Primary adrenocortical insufficiency: Secondary | ICD-10-CM | POA: Diagnosis not present

## 2023-07-11 DIAGNOSIS — E1065 Type 1 diabetes mellitus with hyperglycemia: Secondary | ICD-10-CM | POA: Diagnosis not present

## 2023-07-26 DIAGNOSIS — E119 Type 2 diabetes mellitus without complications: Secondary | ICD-10-CM | POA: Diagnosis not present

## 2023-08-02 ENCOUNTER — Other Ambulatory Visit: Payer: Self-pay | Admitting: Internal Medicine

## 2023-08-02 DIAGNOSIS — Z1231 Encounter for screening mammogram for malignant neoplasm of breast: Secondary | ICD-10-CM

## 2023-08-08 DIAGNOSIS — E119 Type 2 diabetes mellitus without complications: Secondary | ICD-10-CM | POA: Diagnosis not present

## 2023-08-14 DIAGNOSIS — E1065 Type 1 diabetes mellitus with hyperglycemia: Secondary | ICD-10-CM | POA: Diagnosis not present

## 2023-08-14 DIAGNOSIS — Z794 Long term (current) use of insulin: Secondary | ICD-10-CM | POA: Diagnosis not present

## 2023-08-14 DIAGNOSIS — E271 Primary adrenocortical insufficiency: Secondary | ICD-10-CM | POA: Diagnosis not present

## 2023-08-28 DIAGNOSIS — M9905 Segmental and somatic dysfunction of pelvic region: Secondary | ICD-10-CM | POA: Diagnosis not present

## 2023-08-28 DIAGNOSIS — M9901 Segmental and somatic dysfunction of cervical region: Secondary | ICD-10-CM | POA: Diagnosis not present

## 2023-08-28 DIAGNOSIS — M9903 Segmental and somatic dysfunction of lumbar region: Secondary | ICD-10-CM | POA: Diagnosis not present

## 2023-08-28 DIAGNOSIS — M9902 Segmental and somatic dysfunction of thoracic region: Secondary | ICD-10-CM | POA: Diagnosis not present

## 2023-08-31 ENCOUNTER — Ambulatory Visit: Payer: BC Managed Care – PPO

## 2023-09-05 ENCOUNTER — Ambulatory Visit
Admission: RE | Admit: 2023-09-05 | Discharge: 2023-09-05 | Disposition: A | Discharge status: Home / Self Care | Payer: Medicare Other | Source: Ambulatory Visit | Attending: Internal Medicine | Admitting: Internal Medicine

## 2023-09-05 DIAGNOSIS — Z1231 Encounter for screening mammogram for malignant neoplasm of breast: Secondary | ICD-10-CM | POA: Diagnosis not present

## 2023-09-07 ENCOUNTER — Ambulatory Visit: Payer: BC Managed Care – PPO

## 2023-09-11 DIAGNOSIS — E119 Type 2 diabetes mellitus without complications: Secondary | ICD-10-CM | POA: Diagnosis not present

## 2023-09-23 DIAGNOSIS — E271 Primary adrenocortical insufficiency: Secondary | ICD-10-CM | POA: Diagnosis not present

## 2023-09-23 DIAGNOSIS — E11649 Type 2 diabetes mellitus with hypoglycemia without coma: Secondary | ICD-10-CM | POA: Diagnosis not present

## 2023-09-23 DIAGNOSIS — E161 Other hypoglycemia: Secondary | ICD-10-CM | POA: Diagnosis not present

## 2023-09-23 DIAGNOSIS — R531 Weakness: Secondary | ICD-10-CM | POA: Diagnosis not present

## 2023-09-23 DIAGNOSIS — R55 Syncope and collapse: Secondary | ICD-10-CM | POA: Diagnosis not present

## 2023-09-23 DIAGNOSIS — R0902 Hypoxemia: Secondary | ICD-10-CM | POA: Diagnosis not present

## 2023-09-23 DIAGNOSIS — E162 Hypoglycemia, unspecified: Secondary | ICD-10-CM | POA: Diagnosis not present

## 2023-09-23 DIAGNOSIS — I213 ST elevation (STEMI) myocardial infarction of unspecified site: Secondary | ICD-10-CM | POA: Diagnosis not present

## 2023-09-25 DIAGNOSIS — R55 Syncope and collapse: Secondary | ICD-10-CM | POA: Diagnosis not present

## 2023-10-10 DIAGNOSIS — E782 Mixed hyperlipidemia: Secondary | ICD-10-CM | POA: Diagnosis not present

## 2023-10-10 DIAGNOSIS — Z9181 History of falling: Secondary | ICD-10-CM | POA: Diagnosis not present

## 2023-10-10 DIAGNOSIS — E2740 Unspecified adrenocortical insufficiency: Secondary | ICD-10-CM | POA: Diagnosis not present

## 2023-10-10 DIAGNOSIS — M810 Age-related osteoporosis without current pathological fracture: Secondary | ICD-10-CM | POA: Diagnosis not present

## 2023-10-10 DIAGNOSIS — Z0000 Encounter for general adult medical examination without abnormal findings: Secondary | ICD-10-CM | POA: Diagnosis not present

## 2023-10-10 DIAGNOSIS — E1065 Type 1 diabetes mellitus with hyperglycemia: Secondary | ICD-10-CM | POA: Diagnosis not present

## 2023-10-10 DIAGNOSIS — E039 Hypothyroidism, unspecified: Secondary | ICD-10-CM | POA: Diagnosis not present

## 2023-10-10 DIAGNOSIS — E271 Primary adrenocortical insufficiency: Secondary | ICD-10-CM | POA: Diagnosis not present

## 2023-10-10 DIAGNOSIS — G43909 Migraine, unspecified, not intractable, without status migrainosus: Secondary | ICD-10-CM | POA: Diagnosis not present

## 2023-10-16 DIAGNOSIS — E1065 Type 1 diabetes mellitus with hyperglycemia: Secondary | ICD-10-CM | POA: Diagnosis not present

## 2023-10-16 DIAGNOSIS — Z794 Long term (current) use of insulin: Secondary | ICD-10-CM | POA: Diagnosis not present

## 2023-10-16 DIAGNOSIS — E271 Primary adrenocortical insufficiency: Secondary | ICD-10-CM | POA: Diagnosis not present

## 2023-10-17 DIAGNOSIS — M9901 Segmental and somatic dysfunction of cervical region: Secondary | ICD-10-CM | POA: Diagnosis not present

## 2023-10-17 DIAGNOSIS — M9903 Segmental and somatic dysfunction of lumbar region: Secondary | ICD-10-CM | POA: Diagnosis not present

## 2023-10-17 DIAGNOSIS — M9902 Segmental and somatic dysfunction of thoracic region: Secondary | ICD-10-CM | POA: Diagnosis not present

## 2023-10-17 DIAGNOSIS — M9905 Segmental and somatic dysfunction of pelvic region: Secondary | ICD-10-CM | POA: Diagnosis not present

## 2023-10-23 ENCOUNTER — Encounter: Payer: BC Managed Care – PPO | Attending: Internal Medicine | Admitting: Nutrition

## 2023-10-23 DIAGNOSIS — Z713 Dietary counseling and surveillance: Secondary | ICD-10-CM | POA: Insufficient documentation

## 2023-10-23 DIAGNOSIS — E1065 Type 1 diabetes mellitus with hyperglycemia: Secondary | ICD-10-CM | POA: Diagnosis not present

## 2023-10-23 DIAGNOSIS — E10649 Type 1 diabetes mellitus with hypoglycemia without coma: Secondary | ICD-10-CM

## 2023-10-24 NOTE — Progress Notes (Unsigned)
Patient is not certain why she is here today.  She is here with her husband, and reports that she is working with a Data processing manager and has a meal written meal plan and is following this without problems.  She reports having been woken up last night at 3AM with her sensor going off saying her blood sugar was low.  She denied symptoms of low blood sugar, but treated it with juice and went back to sleep.  Reports blood sugar was 142 this AM.   We discussed the importance of testing  blood sugars with a fingerstick to confirm low, and discussed the difference between sensor glucose and blood glucose.  This was something that they did not know and felt happier that they were here. She also reported that alarm was going off, and it was reading 6.  Blood sugar was 70.  She was given 4 ounces of apple juice and she reported feeling better in 10 minutes.  We discussed the fact that she still had 2u of IOB and what this ment when treating low blood sugars.  She reports that her breakfast this morning was not as much as she usually eats, but that it was not that much less than normal.  She was encouraged to call her MD to inform him of this incident. We discussed the basics of meal planning, making sure that all meals have carbohydrate, Protein, and fat,  And how each one of these effects blood sugar and what foods were in each category.  They reported good understanding of this.  She reports that she does not alter the insulin dose for meal, despite sometimes eating more/or less.  We discussed general guidelines for how to adjust insulin dose for times of exercise and eating more/less,especially during the upcoming Thanksgiving meal.   She is exercising, walking and going to the gym and reported passing out at the gym due to a low blood sugar.   We reviewed the needing insulin dose adjustments for this and the symptoms and treatments needed, as well as how long after exercise the blood sugar will continue to drop-based on  her intensity and length of time of exercise.   They had no final questions

## 2023-10-25 NOTE — Patient Instructions (Signed)
Decrease insulin dose by 1-2 units on the meal before going to the gym.   Use only glucose tablets when low at the gym, for a more rapid blood sugar rise. Make sure all meals have protein, carbohydrate, and a little fat. Adjust meal time insulin dose by 1-2units when eating more/less food

## 2023-10-30 DIAGNOSIS — E119 Type 2 diabetes mellitus without complications: Secondary | ICD-10-CM | POA: Diagnosis not present

## 2023-11-07 DIAGNOSIS — D225 Melanocytic nevi of trunk: Secondary | ICD-10-CM | POA: Diagnosis not present

## 2023-11-07 DIAGNOSIS — L821 Other seborrheic keratosis: Secondary | ICD-10-CM | POA: Diagnosis not present

## 2023-11-07 DIAGNOSIS — L578 Other skin changes due to chronic exposure to nonionizing radiation: Secondary | ICD-10-CM | POA: Diagnosis not present

## 2023-11-07 DIAGNOSIS — L820 Inflamed seborrheic keratosis: Secondary | ICD-10-CM | POA: Diagnosis not present

## 2024-02-07 DIAGNOSIS — E785 Hyperlipidemia, unspecified: Secondary | ICD-10-CM | POA: Diagnosis not present

## 2024-02-07 DIAGNOSIS — M1990 Unspecified osteoarthritis, unspecified site: Secondary | ICD-10-CM | POA: Diagnosis not present

## 2024-02-07 DIAGNOSIS — G4700 Insomnia, unspecified: Secondary | ICD-10-CM | POA: Diagnosis not present

## 2024-02-07 DIAGNOSIS — I3489 Other nonrheumatic mitral valve disorders: Secondary | ICD-10-CM | POA: Diagnosis not present

## 2024-02-07 DIAGNOSIS — F3342 Major depressive disorder, recurrent, in full remission: Secondary | ICD-10-CM | POA: Diagnosis not present

## 2024-02-07 DIAGNOSIS — G43909 Migraine, unspecified, not intractable, without status migrainosus: Secondary | ICD-10-CM | POA: Diagnosis not present

## 2024-02-07 DIAGNOSIS — F419 Anxiety disorder, unspecified: Secondary | ICD-10-CM | POA: Diagnosis not present

## 2024-02-07 DIAGNOSIS — M8580 Other specified disorders of bone density and structure, unspecified site: Secondary | ICD-10-CM | POA: Diagnosis not present

## 2024-02-07 DIAGNOSIS — Z7952 Long term (current) use of systemic steroids: Secondary | ICD-10-CM | POA: Diagnosis not present

## 2024-02-07 DIAGNOSIS — E1069 Type 1 diabetes mellitus with other specified complication: Secondary | ICD-10-CM | POA: Diagnosis not present

## 2024-02-07 DIAGNOSIS — E271 Primary adrenocortical insufficiency: Secondary | ICD-10-CM | POA: Diagnosis not present

## 2024-02-07 DIAGNOSIS — Z794 Long term (current) use of insulin: Secondary | ICD-10-CM | POA: Diagnosis not present

## 2024-02-23 ENCOUNTER — Other Ambulatory Visit: Payer: Self-pay | Admitting: Internal Medicine

## 2024-02-23 DIAGNOSIS — Z1231 Encounter for screening mammogram for malignant neoplasm of breast: Secondary | ICD-10-CM

## 2024-02-25 DIAGNOSIS — R051 Acute cough: Secondary | ICD-10-CM | POA: Diagnosis not present

## 2024-02-25 DIAGNOSIS — J069 Acute upper respiratory infection, unspecified: Secondary | ICD-10-CM | POA: Diagnosis not present

## 2024-02-25 DIAGNOSIS — R0981 Nasal congestion: Secondary | ICD-10-CM | POA: Diagnosis not present

## 2024-02-25 DIAGNOSIS — J029 Acute pharyngitis, unspecified: Secondary | ICD-10-CM | POA: Diagnosis not present

## 2024-03-12 DIAGNOSIS — E109 Type 1 diabetes mellitus without complications: Secondary | ICD-10-CM | POA: Diagnosis not present

## 2024-03-12 DIAGNOSIS — Z794 Long term (current) use of insulin: Secondary | ICD-10-CM | POA: Diagnosis not present

## 2024-03-12 DIAGNOSIS — E271 Primary adrenocortical insufficiency: Secondary | ICD-10-CM | POA: Diagnosis not present

## 2024-03-22 DIAGNOSIS — E119 Type 2 diabetes mellitus without complications: Secondary | ICD-10-CM | POA: Diagnosis not present

## 2024-04-09 DIAGNOSIS — G43909 Migraine, unspecified, not intractable, without status migrainosus: Secondary | ICD-10-CM | POA: Diagnosis not present

## 2024-04-09 DIAGNOSIS — E271 Primary adrenocortical insufficiency: Secondary | ICD-10-CM | POA: Diagnosis not present

## 2024-04-09 DIAGNOSIS — E039 Hypothyroidism, unspecified: Secondary | ICD-10-CM | POA: Diagnosis not present

## 2024-04-09 DIAGNOSIS — M810 Age-related osteoporosis without current pathological fracture: Secondary | ICD-10-CM | POA: Diagnosis not present

## 2024-04-09 DIAGNOSIS — E782 Mixed hyperlipidemia: Secondary | ICD-10-CM | POA: Diagnosis not present

## 2024-04-09 DIAGNOSIS — E871 Hypo-osmolality and hyponatremia: Secondary | ICD-10-CM | POA: Diagnosis not present

## 2024-04-09 DIAGNOSIS — E1069 Type 1 diabetes mellitus with other specified complication: Secondary | ICD-10-CM | POA: Diagnosis not present

## 2024-04-09 DIAGNOSIS — G2581 Restless legs syndrome: Secondary | ICD-10-CM | POA: Diagnosis not present

## 2024-04-26 DIAGNOSIS — E119 Type 2 diabetes mellitus without complications: Secondary | ICD-10-CM | POA: Diagnosis not present

## 2024-05-29 DIAGNOSIS — H25813 Combined forms of age-related cataract, bilateral: Secondary | ICD-10-CM | POA: Diagnosis not present

## 2024-05-29 DIAGNOSIS — H52223 Regular astigmatism, bilateral: Secondary | ICD-10-CM | POA: Diagnosis not present

## 2024-05-29 DIAGNOSIS — E109 Type 1 diabetes mellitus without complications: Secondary | ICD-10-CM | POA: Diagnosis not present

## 2024-05-29 DIAGNOSIS — H5203 Hypermetropia, bilateral: Secondary | ICD-10-CM | POA: Diagnosis not present

## 2024-05-29 DIAGNOSIS — H43813 Vitreous degeneration, bilateral: Secondary | ICD-10-CM | POA: Diagnosis not present

## 2024-05-29 DIAGNOSIS — Z794 Long term (current) use of insulin: Secondary | ICD-10-CM | POA: Diagnosis not present

## 2024-05-29 DIAGNOSIS — H524 Presbyopia: Secondary | ICD-10-CM | POA: Diagnosis not present

## 2024-06-18 DIAGNOSIS — E109 Type 1 diabetes mellitus without complications: Secondary | ICD-10-CM | POA: Diagnosis not present

## 2024-06-18 DIAGNOSIS — Z794 Long term (current) use of insulin: Secondary | ICD-10-CM | POA: Diagnosis not present

## 2024-06-18 DIAGNOSIS — E271 Primary adrenocortical insufficiency: Secondary | ICD-10-CM | POA: Diagnosis not present

## 2024-06-18 DIAGNOSIS — G43909 Migraine, unspecified, not intractable, without status migrainosus: Secondary | ICD-10-CM | POA: Diagnosis not present

## 2024-06-27 DIAGNOSIS — E119 Type 2 diabetes mellitus without complications: Secondary | ICD-10-CM | POA: Diagnosis not present

## 2024-07-09 DIAGNOSIS — E119 Type 2 diabetes mellitus without complications: Secondary | ICD-10-CM | POA: Diagnosis not present

## 2024-07-11 DIAGNOSIS — Z794 Long term (current) use of insulin: Secondary | ICD-10-CM | POA: Diagnosis not present

## 2024-07-11 DIAGNOSIS — E271 Primary adrenocortical insufficiency: Secondary | ICD-10-CM | POA: Diagnosis not present

## 2024-07-11 DIAGNOSIS — E109 Type 1 diabetes mellitus without complications: Secondary | ICD-10-CM | POA: Diagnosis not present

## 2024-08-16 DIAGNOSIS — Z794 Long term (current) use of insulin: Secondary | ICD-10-CM | POA: Diagnosis not present

## 2024-08-16 DIAGNOSIS — E109 Type 1 diabetes mellitus without complications: Secondary | ICD-10-CM | POA: Diagnosis not present

## 2024-08-16 DIAGNOSIS — E271 Primary adrenocortical insufficiency: Secondary | ICD-10-CM | POA: Diagnosis not present

## 2024-10-11 DIAGNOSIS — Z13220 Encounter for screening for lipoid disorders: Secondary | ICD-10-CM | POA: Diagnosis not present

## 2024-10-11 DIAGNOSIS — E271 Primary adrenocortical insufficiency: Secondary | ICD-10-CM | POA: Diagnosis not present

## 2024-10-11 DIAGNOSIS — Z136 Encounter for screening for cardiovascular disorders: Secondary | ICD-10-CM | POA: Diagnosis not present

## 2024-10-11 DIAGNOSIS — E109 Type 1 diabetes mellitus without complications: Secondary | ICD-10-CM | POA: Diagnosis not present

## 2024-10-11 DIAGNOSIS — Z794 Long term (current) use of insulin: Secondary | ICD-10-CM | POA: Diagnosis not present

## 2024-10-21 ENCOUNTER — Other Ambulatory Visit (HOSPITAL_BASED_OUTPATIENT_CLINIC_OR_DEPARTMENT_OTHER): Payer: Self-pay | Admitting: Internal Medicine

## 2024-10-21 ENCOUNTER — Ambulatory Visit
Admission: RE | Admit: 2024-10-21 | Discharge: 2024-10-21 | Disposition: A | Discharge status: Home / Self Care | Source: Ambulatory Visit | Attending: Internal Medicine | Admitting: Internal Medicine

## 2024-10-21 DIAGNOSIS — E782 Mixed hyperlipidemia: Secondary | ICD-10-CM | POA: Diagnosis not present

## 2024-10-21 DIAGNOSIS — F419 Anxiety disorder, unspecified: Secondary | ICD-10-CM | POA: Diagnosis not present

## 2024-10-21 DIAGNOSIS — M810 Age-related osteoporosis without current pathological fracture: Secondary | ICD-10-CM

## 2024-10-21 DIAGNOSIS — J309 Allergic rhinitis, unspecified: Secondary | ICD-10-CM | POA: Diagnosis not present

## 2024-10-21 DIAGNOSIS — G43909 Migraine, unspecified, not intractable, without status migrainosus: Secondary | ICD-10-CM | POA: Diagnosis not present

## 2024-10-21 DIAGNOSIS — E271 Primary adrenocortical insufficiency: Secondary | ICD-10-CM | POA: Diagnosis not present

## 2024-10-21 DIAGNOSIS — Z1331 Encounter for screening for depression: Secondary | ICD-10-CM | POA: Diagnosis not present

## 2024-10-21 DIAGNOSIS — Z0000 Encounter for general adult medical examination without abnormal findings: Secondary | ICD-10-CM | POA: Diagnosis not present

## 2024-10-21 DIAGNOSIS — Z1231 Encounter for screening mammogram for malignant neoplasm of breast: Secondary | ICD-10-CM

## 2024-10-21 DIAGNOSIS — G2581 Restless legs syndrome: Secondary | ICD-10-CM | POA: Diagnosis not present

## 2024-10-21 DIAGNOSIS — E039 Hypothyroidism, unspecified: Secondary | ICD-10-CM | POA: Diagnosis not present

## 2024-10-21 DIAGNOSIS — Z23 Encounter for immunization: Secondary | ICD-10-CM | POA: Diagnosis not present

## 2024-10-21 DIAGNOSIS — E1069 Type 1 diabetes mellitus with other specified complication: Secondary | ICD-10-CM | POA: Diagnosis not present

## 2024-10-21 DIAGNOSIS — E871 Hypo-osmolality and hyponatremia: Secondary | ICD-10-CM | POA: Diagnosis not present
